# Patient Record
Sex: Male | Born: 1955 | Race: White | Hispanic: No | Marital: Single
Health system: Southern US, Community
[De-identification: ages and names within clinical notes are randomized; demographics above are authoritative.]

---

## 2018-01-13 ENCOUNTER — Other Ambulatory Visit (HOSPITAL_COMMUNITY): Payer: Medicaid Other

## 2018-01-13 ENCOUNTER — Inpatient Hospital Stay
Admission: AD | Admit: 2018-01-13 | Discharge: 2018-02-26 | Disposition: A | Discharge status: Skilled Nursing Facility | Payer: Medicaid Other | Source: Other Acute Inpatient Hospital

## 2018-01-13 DIAGNOSIS — G40909 Epilepsy, unspecified, not intractable, without status epilepticus: Secondary | ICD-10-CM

## 2018-01-13 DIAGNOSIS — J449 Chronic obstructive pulmonary disease, unspecified: Secondary | ICD-10-CM

## 2018-01-13 DIAGNOSIS — N170 Acute kidney failure with tubular necrosis: Secondary | ICD-10-CM

## 2018-01-13 DIAGNOSIS — J9621 Acute and chronic respiratory failure with hypoxia: Secondary | ICD-10-CM

## 2018-01-13 DIAGNOSIS — Z931 Gastrostomy status: Secondary | ICD-10-CM

## 2018-01-13 DIAGNOSIS — Z978 Presence of other specified devices: Secondary | ICD-10-CM

## 2018-01-13 DIAGNOSIS — A419 Sepsis, unspecified organism: Secondary | ICD-10-CM

## 2018-01-13 DIAGNOSIS — Z9289 Personal history of other medical treatment: Secondary | ICD-10-CM

## 2018-01-13 DIAGNOSIS — J4489 Other specified chronic obstructive pulmonary disease: Secondary | ICD-10-CM

## 2018-01-13 DIAGNOSIS — J189 Pneumonia, unspecified organism: Secondary | ICD-10-CM

## 2018-01-13 DIAGNOSIS — R6520 Severe sepsis without septic shock: Secondary | ICD-10-CM

## 2018-01-13 LAB — COMPREHENSIVE METABOLIC PANEL
ALBUMIN: 2.1 g/dL — AB (ref 3.5–5.0)
ALT: 21 U/L (ref 0–44)
AST: 21 U/L (ref 15–41)
Alkaline Phosphatase: 50 U/L (ref 38–126)
Anion gap: 7 (ref 5–15)
BUN: 14 mg/dL (ref 8–23)
CO2: 25 mmol/L (ref 22–32)
Calcium: 8.3 mg/dL — ABNORMAL LOW (ref 8.9–10.3)
Chloride: 102 mmol/L (ref 98–111)
Creatinine, Ser: 1.08 mg/dL (ref 0.61–1.24)
GFR calc Af Amer: >60 mL/min (ref >60)
GFR calc non Af Amer: >60 mL/min (ref >60)
Glucose, Bld: 95 mg/dL (ref 70–99)
Potassium: 3.9 mmol/L (ref 3.5–5.1)
Sodium: 134 mmol/L — ABNORMAL LOW (ref 135–145)
Total Bilirubin: 0.4 mg/dL (ref 0.3–1.2)
Total Protein: 7.2 g/dL (ref 6.5–8.1)

## 2018-01-13 LAB — BLOOD GAS, ARTERIAL
ACID-BASE EXCESS: 3.8 mmol/L — AB (ref 0.0–2.0)
Bicarbonate: 26.1 mmol/L (ref 20.0–28.0)
FIO2: 30
MECHVT: 500 mL
O2 Saturation: 98.5 %
PATIENT TEMPERATURE: 98.6
PEEP: 5 cmH2O
RATE: 18 resp/min
pCO2 arterial: 27.7 mmHg — ABNORMAL LOW (ref 32.0–48.0)
pH, Arterial: 7.58 — ABNORMAL HIGH (ref 7.350–7.450)
pO2, Arterial: 96.6 mmHg (ref 83.0–108.0)

## 2018-01-13 LAB — CBC WITH DIFFERENTIAL/PLATELET
Abs Immature Granulocytes: 0.12 10*3/uL — ABNORMAL HIGH (ref 0.00–0.07)
BASOS PCT: 0 %
Basophils Absolute: 0 10*3/uL (ref 0.0–0.1)
Eosinophils Absolute: 0.4 10*3/uL (ref 0.0–0.5)
Eosinophils Relative: 4 %
HCT: 29.7 % — ABNORMAL LOW (ref 39.0–52.0)
Hemoglobin: 9.3 g/dL — ABNORMAL LOW (ref 13.0–17.0)
IMMATURE GRANULOCYTES: 1 %
Lymphocytes Relative: 25 %
Lymphs Abs: 2.1 10*3/uL (ref 0.7–4.0)
MCH: 30 pg (ref 26.0–34.0)
MCHC: 31.3 g/dL (ref 30.0–36.0)
MCV: 95.8 fL (ref 80.0–100.0)
MONO ABS: 0.6 10*3/uL (ref 0.1–1.0)
Monocytes Relative: 8 %
NRBC: 0.2 % (ref 0.0–0.2)
Neutro Abs: 5.1 10*3/uL (ref 1.7–7.7)
Neutrophils Relative %: 62 %
Platelets: 252 10*3/uL (ref 150–400)
RBC: 3.1 MIL/uL — AB (ref 4.22–5.81)
RDW: 13.3 % (ref 11.5–15.5)
WBC: 8.3 10*3/uL (ref 4.0–10.5)

## 2018-01-13 LAB — PROTIME-INR
INR: 0.94
Prothrombin Time: 12.5 seconds (ref 11.4–15.2)

## 2018-01-14 DIAGNOSIS — J9621 Acute and chronic respiratory failure with hypoxia: Secondary | ICD-10-CM | POA: Diagnosis not present

## 2018-01-14 DIAGNOSIS — N170 Acute kidney failure with tubular necrosis: Secondary | ICD-10-CM

## 2018-01-14 DIAGNOSIS — G40909 Epilepsy, unspecified, not intractable, without status epilepticus: Secondary | ICD-10-CM

## 2018-01-14 DIAGNOSIS — R6520 Severe sepsis without septic shock: Secondary | ICD-10-CM

## 2018-01-14 DIAGNOSIS — Z9289 Personal history of other medical treatment: Secondary | ICD-10-CM | POA: Diagnosis not present

## 2018-01-14 DIAGNOSIS — J449 Chronic obstructive pulmonary disease, unspecified: Secondary | ICD-10-CM

## 2018-01-14 DIAGNOSIS — A419 Sepsis, unspecified organism: Secondary | ICD-10-CM

## 2018-01-14 LAB — BLOOD GAS, ARTERIAL
Acid-Base Excess: 2.7 mmol/L — ABNORMAL HIGH (ref 0.0–2.0)
Bicarbonate: 25.7 mmol/L (ref 20.0–28.0)
FIO2: 0.3
MECHVT: 500 mL
O2 Saturation: 99.3 %
PATIENT TEMPERATURE: 98.6
PCO2 ART: 32.5 mmHg (ref 32.0–48.0)
PEEP: 5 cmH2O
RATE: 14 resp/min
pH, Arterial: 7.509 — ABNORMAL HIGH (ref 7.350–7.450)
pO2, Arterial: 146 mmHg — ABNORMAL HIGH (ref 83.0–108.0)

## 2018-01-14 NOTE — Consult Note (Signed)
Pulmonary Critical Care Medicine [facility] [place]  PULMONARY SERVICE  Date of Service: 01/14/2018  PULMONARY CRITICAL CARE CONSULT   Joseph Patterson  GEZ:[id number]  DOB: [birth year]   DOA: 01/13/2018  Referring Physician: Carron Curie, MD  HPI: Joseph Patterson is a 63 y.o. male seen for follow up of Acute on Chronic Respiratory Failure.  Patient is transferred to our facility for further management and weaning.  This patient has a past medical history significant for sepsis chronic kidney disease hypothyroidism coronary artery disease cerebrovascular accident chronic asthma seizure disorder.  Patient is apparently a nursing home resident and back in 2016 had a stroke.  Patient subsequently has had recurrence of CVAs also has had chronic suprapubic catheter and ESBL E. coli infections.  Patient was transferred to the ED because of fevers.  Was noted to have tachycardia and also significantly hypoxic.  Patient's creatinine had also elevated from his baseline.  The patient subsequently ended up intubated on the ventilator.  And is now still intubated on the ventilator.  Chest x-ray initially was clear without any infiltrates.  He eventually was started on CPAP trials but did not tolerate.  Review of Systems:  ROS performed and is unremarkable other than noted above.   Past medical history: Hemorrhagic stroke Neurogenic bladder Chronic kidney disease Dysphagia Cardiac arrest C. difficile colitis Asthma GERD Hypertension Hypothyroidism Coronary artery disease Schizophrenia Seizure disorder  Past surgical history: Suprapubic catheter Tracheostomy PEG tube Coronary angioplasty  Allergies: Patient is allergic to warfarin unknown reaction  Social history: Resident of nursing home basically bedbound  Family history: Hyper thyroidism hypothyroidism  Medications: Reviewed on Rounds  Physical Exam:  Vitals: Temperature 97.5 pulse 67 respiratory 18 blood pressure 114/70  saturations 100%  Ventilator Settings mode of ventilation assist control FiO2 30% tidal volume 527 PEEP 5 patient is orally intubated and requiring sedation high risk airway  . General: Comfortable at this time . Eyes: Grossly normal lids, irises & conjunctiva . ENT: grossly tongue is normal . Neck: no obvious mass . Cardiovascular: S1-S2 normal no gallop or rub . Respiratory: Coarse breath sounds few scattered rhonchi . Abdomen: Obese and soft . Skin: no rash seen on limited exam . Musculoskeletal: not rigid . Psychiatric:unable to assess . Neurologic: no seizure no involuntary movements         Labs on Admission:  Basic Metabolic Panel: Recent Labs  Lab 01/13/18 1906  NA 134*  K 3.9  CL 102  CO2 25  GLUCOSE 95  BUN 14  CREATININE 1.08  CALCIUM 8.3*    Recent Labs  Lab 01/13/18 2230 01/14/18 0500  PHART 7.580* 7.509*  PCO2ART 27.7* 32.5  PO2ART 96.6 146*  HCO3 26.1 25.7  O2SAT 98.5 99.3    Liver Function Tests: Recent Labs  Lab 01/13/18 1906  AST 21  ALT 21  ALKPHOS 50  BILITOT 0.4  PROT 7.2  ALBUMIN 2.1*   No results for input(s): LIPASE, AMYLASE in the last 168 hours. No results for input(s): AMMONIA in the last 168 hours.  CBC: Recent Labs  Lab 01/13/18 1906  WBC 8.3  NEUTROABS 5.1  HGB 9.3*  HCT 29.7*  MCV 95.8  PLT 252    Cardiac Enzymes: No results for input(s): CKTOTAL, CKMB, CKMBINDEX, TROPONINI in the last 168 hours.  BNP (last 3 results) No results for input(s): BNP in the last 8760 hours.  ProBNP (last 3 results) No results for input(s): PROBNP in the last 8760 hours.   Radiological  Exams on Admission: Dg Abdomen Peg Tube Location  Result Date: 01/13/2018 CLINICAL DATA:  Peg tube placement. 30 mL of Gastrografin injected through the tube. EXAM: ABDOMEN - 1 VIEW COMPARISON:  None. FINDINGS: The injected contrast fills the stomach. There is no contrast extravasation. Gastrostomy tube projects along inferior mid stomach. Normal  bowel gas pattern. IMPRESSION: Well-positioned gastrostomy tube.  No contrast extravasation. Electronically Signed   By: Amie Portland M.D.   On: 01/13/2018 20:49   Dg Chest Port 1 View  Result Date: 01/13/2018 CLINICAL DATA:  Endotracheal tube placement. EXAM: PORTABLE CHEST 1 VIEW COMPARISON:  None. FINDINGS: Endotracheal tube tip projects 3.8 cm above the Carina. Right internal jugular central venous line, tip in the mid to lower superior vena cava. Cardiac silhouette is normal in size. No mediastinal or hilar masses. Mild opacity at the medial left lung base, most likely atelectasis. Lungs otherwise clear. No convincing pleural effusion. No pneumothorax. Multiple old left-sided rib fractures. IMPRESSION: 1. Endotracheal tube tip projects 3.8 cm above the Carina. Right internal jugular central venous line tip projects at the mid to lower superior vena cava. No pneumothorax. 2. Mild opacity at the medial left lung base, most likely atelectasis. No convincing acute cardiopulmonary disease. Electronically Signed   By: Amie Portland M.D.   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 Acute on chronic respiratory failure with hypoxia at this time patient is on full vent support.  Patient is on assist control mode is on 30% FiO2 with an endotracheal tube in place.  Patient's mechanics are poor is not able to wean at this time.  Patient is requiring sedation because of patient safety concerns that he has a high risk airway at this time.  Chest x-ray had shown some changes of atelectasis but no acute pneumonitis was observed. 2. Severe sepsis clinically improving secondary to urinary tract infection have grown procidentia cultures. 3. Acute renal failure likely  secondary to dehydration and urinary tract infection.  Now making better urine need to continue to follow the labs to improvement. 4. Seizure disorder patient has no active seizures noted at this time we will continue to monitor. 5. Chronic obstructive asthma we will continue with as needed duo nebs as needed continue supportive care  I have personally seen and evaluated the patient, evaluated laboratory and imaging results, formulated the assessment and plan and placed orders.  Patient is critically ill in danger of cardiac arrest and death requiring close monitoring secondary to high risk airway as well as ongoing sedation monitoring. The Patient requires high complexity decision making for assessment and support.  Case was discussed on Rounds with the Respiratory Therapy Staff Time Spent  Yevonne Pax, MD [facility] Pulmonary Critical Care Medicine Sleep Medicine

## 2018-01-15 DIAGNOSIS — N170 Acute kidney failure with tubular necrosis: Secondary | ICD-10-CM

## 2018-01-15 DIAGNOSIS — J449 Chronic obstructive pulmonary disease, unspecified: Secondary | ICD-10-CM | POA: Diagnosis not present

## 2018-01-15 DIAGNOSIS — G40909 Epilepsy, unspecified, not intractable, without status epilepticus: Secondary | ICD-10-CM

## 2018-01-15 DIAGNOSIS — R6520 Severe sepsis without septic shock: Secondary | ICD-10-CM

## 2018-01-15 DIAGNOSIS — J9621 Acute and chronic respiratory failure with hypoxia: Secondary | ICD-10-CM | POA: Diagnosis not present

## 2018-01-15 DIAGNOSIS — Z9289 Personal history of other medical treatment: Secondary | ICD-10-CM | POA: Diagnosis not present

## 2018-01-15 DIAGNOSIS — A419 Sepsis, unspecified organism: Secondary | ICD-10-CM

## 2018-01-15 NOTE — Progress Notes (Signed)
Pulmonary Critical Care Medicine [facility] [place]   PULMONARY CRITICAL CARE SERVICE  PROGRESS NOTE  Date of Service: 01/15/2018  Joseph Patterson  [id number]:[id number]  DOB: [birth year]   DOA: 01/13/2018  Referring Physician: Carron Curie, MD  HPI: Joseph Patterson is a 63 y.o. male seen for follow up of Acute on Chronic Respiratory Failure.  Patient remains critically ill orally intubated on the ventilator.  Was placed on spontaneous breathing trial which the patient did not tolerate today.  Respiratory rate went up and patient had drop in saturations.  Patient now is back on full support on assist control mode  Medications: Reviewed on Rounds  Physical Exam:  Vitals: Temperature 97.2 pulse 67 respiratory 18 blood pressure 132/83 saturations 100%  Ventilator Settings mode ventilation assist control FiO2 30% tidal volume 619 PEEP 5  . General: Comfortable at this time . Eyes: Grossly normal lids, irises & conjunctiva . ENT: grossly tongue is normal . Neck: no obvious mass . Cardiovascular: S1 S2 normal no gallop . Respiratory: Coarse rhonchi noted bilaterally . Abdomen: soft . Skin: no rash seen on limited exam . Musculoskeletal: not rigid . Psychiatric:unable to assess . Neurologic: no seizure no involuntary movements         Lab Data:   Basic Metabolic Panel: Recent Labs  Lab 01/13/18 1906  NA 134*  K 3.9  CL 102  CO2 25  GLUCOSE 95  BUN 14  CREATININE 1.08  CALCIUM 8.3*    ABG: Recent Labs  Lab 01/13/18 2230 01/14/18 0500  PHART 7.580* 7.509*  PCO2ART 27.7* 32.5  PO2ART 96.6 146*  HCO3 26.1 25.7  O2SAT 98.5 99.3    Liver Function Tests: Recent Labs  Lab 01/13/18 1906  AST 21  ALT 21  ALKPHOS 50  BILITOT 0.4  PROT 7.2  ALBUMIN 2.1*   No results for input(s): LIPASE, AMYLASE in the last 168 hours. No results for input(s): AMMONIA in the last 168 hours.  CBC: Recent Labs  Lab 01/13/18 1906  WBC 8.3  NEUTROABS 5.1  HGB 9.3*  HCT 29.7*   MCV 95.8  PLT 252    Cardiac Enzymes: No results for input(s): CKTOTAL, CKMB, CKMBINDEX, TROPONINI in the last 168 hours.  BNP (last 3 results) No results for input(s): BNP in the last 8760 hours.  ProBNP (last 3 results) No results for input(s): PROBNP in the last 8760 hours.  Radiological Exams: Dg Abdomen Peg Tube Location  Result Date: 01/13/2018 CLINICAL DATA:  Peg tube placement. 30 mL of Gastrografin injected through the tube. EXAM: ABDOMEN - 1 VIEW COMPARISON:  None. FINDINGS: The injected contrast fills the stomach. There is no contrast extravasation. Gastrostomy tube projects along inferior mid stomach. Normal bowel gas pattern. IMPRESSION: Well-positioned gastrostomy tube.  No contrast extravasation. Electronically Signed   By: Amie Portland M.D.   On: 01/13/2018 20:49   Dg Chest Port 1 View  Result Date: 01/13/2018 CLINICAL DATA:  Endotracheal tube placement. EXAM: PORTABLE CHEST 1 VIEW COMPARISON:  None. FINDINGS: Endotracheal tube tip projects 3.8 cm above the Carina. Right internal jugular central venous line, tip in the mid to lower superior vena cava. Cardiac silhouette is normal in size. No mediastinal or hilar masses. Mild opacity at the medial left lung base, most likely atelectasis. Lungs otherwise clear. No convincing pleural effusion. No pneumothorax. Multiple old left-sided rib fractures. IMPRESSION: 1. Endotracheal tube tip projects 3.8 cm above the Carina. Right internal jugular central venous line tip projects at the mid  to lower superior vena cava. No pneumothorax. 2. Mild opacity at the medial left lung base, most likely atelectasis. No convincing acute cardiopulmonary disease. Electronically Signed   By: Amie Portland M.D.   On: 01/13/2018 20:51    Assessment/Plan Active Problems:   Acute on chronic respiratory failure with hypoxia (HCC)   Severe sepsis (HCC)   Acute renal failure due to tubular necrosis (HCC)   Seizure disorder (HCC)   Chronic obstructive  asthma (with obstructive pulmonary disease) (HCC)   1. Acute on chronic respiratory failure with hypoxia we will continue with assessing the RSB I and mechanics.  Patient remains orally intubated high risk I think based on his failure to wean and failure to pass the RSB I he will likely need a tracheostomy.  Discussed on multidisciplinary rounds today we will try to get a ENT consultation. 2. Severe sepsis right now is hemodynamically stable secondary to urinary tract infection patient has been not treated. 3. Acute renal failure labs are improving we will continue to monitor closely. 4. Seizure disorder at baseline continue with present management. 5. Chronic obstructive asthma new labs as needed continue with supportive care   I have personally seen and evaluated the patient, evaluated laboratory and imaging results, formulated the assessment and plan and placed orders.  Time 35 minutes patient is critically ill in danger of cardiac arrest we will continue to monitor him patient has high risk airway for dislodgment orally intubated. The Patient requires high complexity decision making for assessment and support.  Case was discussed on Rounds with the Respiratory Therapy Staff  Yevonne Pax, MD [facility] Pulmonary Critical Care Medicine Sleep Medicine

## 2018-01-16 DIAGNOSIS — N170 Acute kidney failure with tubular necrosis: Secondary | ICD-10-CM | POA: Diagnosis not present

## 2018-01-16 DIAGNOSIS — G40909 Epilepsy, unspecified, not intractable, without status epilepticus: Secondary | ICD-10-CM | POA: Diagnosis not present

## 2018-01-16 DIAGNOSIS — J449 Chronic obstructive pulmonary disease, unspecified: Secondary | ICD-10-CM | POA: Diagnosis not present

## 2018-01-16 DIAGNOSIS — J9621 Acute and chronic respiratory failure with hypoxia: Secondary | ICD-10-CM | POA: Diagnosis not present

## 2018-01-16 LAB — BASIC METABOLIC PANEL
ANION GAP: 7 (ref 5–15)
BUN: 18 mg/dL (ref 8–23)
CO2: 25 mmol/L (ref 22–32)
Calcium: 8.7 mg/dL — ABNORMAL LOW (ref 8.9–10.3)
Chloride: 107 mmol/L (ref 98–111)
Creatinine, Ser: 1.08 mg/dL (ref 0.61–1.24)
GFR calc Af Amer: >60 mL/min (ref >60)
GFR calc non Af Amer: >60 mL/min (ref >60)
Glucose, Bld: 117 mg/dL — ABNORMAL HIGH (ref 70–99)
Potassium: 3.5 mmol/L (ref 3.5–5.1)
SODIUM: 139 mmol/L (ref 135–145)

## 2018-01-16 NOTE — Progress Notes (Signed)
Pulmonary Critical Care Medicine [facility] [place]   PULMONARY CRITICAL CARE SERVICE  PROGRESS NOTE  Date of Service: 01/16/2018  Joseph Patterson  [id number]:[id number]  DOB: [birth year]   DOA: 01/13/2018  Referring Physician: Carron Curie, MD  HPI: Joseph Patterson is a 63 y.o. male seen for follow up of Acute on Chronic Respiratory Failure.  Currently is on full support patient is on assist control mode endotracheal tube stays in place.  Patient has not been tolerating any weaning.  Numerous attempts have been made patient has failed the RSB I  Medications: Reviewed on Rounds  Physical Exam:  Vitals: Temperature 96.8 pulse 64 respiratory 18 blood pressure 126/68 saturations 96%  Ventilator Settings mode of ventilation assist control FiO2 28% tidal volume 525 PEEP 5  . General: Comfortable at this time . Eyes: Grossly normal lids, irises & conjunctiva . ENT: grossly tongue is normal . Neck: no obvious mass . Cardiovascular: S1 S2 normal no gallop . Respiratory: No rhonchi or rales are noted at this time . Abdomen: soft . Skin: no rash seen on limited exam . Musculoskeletal: not rigid . Psychiatric:unable to assess . Neurologic: no seizure no involuntary movements         Lab Data:   Basic Metabolic Panel: Recent Labs  Lab 01/13/18 1906 01/16/18 0532  NA 134* 139  K 3.9 3.5  CL 102 107  CO2 25 25  GLUCOSE 95 117*  BUN 14 18  CREATININE 1.08 1.08  CALCIUM 8.3* 8.7*    ABG: Recent Labs  Lab 01/13/18 2230 01/14/18 0500  PHART 7.580* 7.509*  PCO2ART 27.7* 32.5  PO2ART 96.6 146*  HCO3 26.1 25.7  O2SAT 98.5 99.3    Liver Function Tests: Recent Labs  Lab 01/13/18 1906  AST 21  ALT 21  ALKPHOS 50  BILITOT 0.4  PROT 7.2  ALBUMIN 2.1*   No results for input(s): LIPASE, AMYLASE in the last 168 hours. No results for input(s): AMMONIA in the last 168 hours.  CBC: Recent Labs  Lab 01/13/18 1906  WBC 8.3  NEUTROABS 5.1  HGB 9.3*  HCT 29.7*  MCV 95.8   PLT 252    Cardiac Enzymes: No results for input(s): CKTOTAL, CKMB, CKMBINDEX, TROPONINI in the last 168 hours.  BNP (last 3 results) No results for input(s): BNP in the last 8760 hours.  ProBNP (last 3 results) No results for input(s): PROBNP in the last 8760 hours.  Radiological Exams: No results found.  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 Acute on chronic respiratory failure with hypoxia we will continue with full support on the ventilator and assist control mode FiO2 28% tidal volume 525 PEEP 5.  Patient has not been tolerating the RSB I.  Spoke with respiratory therapy will likely need a tracheostomy. 2. Severe sepsis hemodynamically stable we will continue with present management. 3. Acute renal failure follow-up labs stable 4. Seizure disorder no active seizures we will continue to monitor closely 5. COPD PD's meter disease continue with the present management   I have personally seen and evaluated the patient, evaluated laboratory and imaging results, formulated the assessment and plan and placed orders.  Patient critically ill in danger of cardiac arrest time 35 minutes patient has a high risk airway orally  intubated The Patient requires high complexity decision making for assessment and support.  Case was discussed on Rounds with the Respiratory Therapy Staff  Yevonne PaxSaadat A Gwin Eagon, MD [facility] Pulmonary Critical Care Medicine Sleep Medicine

## 2018-01-17 DIAGNOSIS — N170 Acute kidney failure with tubular necrosis: Secondary | ICD-10-CM | POA: Diagnosis not present

## 2018-01-17 DIAGNOSIS — J9621 Acute and chronic respiratory failure with hypoxia: Secondary | ICD-10-CM | POA: Diagnosis not present

## 2018-01-17 DIAGNOSIS — Z9289 Personal history of other medical treatment: Secondary | ICD-10-CM | POA: Diagnosis not present

## 2018-01-17 DIAGNOSIS — J449 Chronic obstructive pulmonary disease, unspecified: Secondary | ICD-10-CM | POA: Diagnosis not present

## 2018-01-17 NOTE — Progress Notes (Addendum)
Pulmonary Critical Care Medicine [facility] [place]   PULMONARY CRITICAL CARE SERVICE  PROGRESS NOTE  Date of Service: 01/17/2018  Joseph Patterson  PJA:[id number]  DOB: 1955-09-19   DOA: 01/13/2018  Referring Physician: Carron Curie, MD  HPI: Joseph Patterson is a 63 y.o. male seen for follow up of Acute on Chronic Respiratory Failure.  Patient is comfortable right now was able to do about 4 hours of pressure support tolerated fairly well.  Right now is back on resting mode and assist control mode  Medications: Reviewed on Rounds  Physical Exam:  Vitals: Temperature 96.7 pulse 85 respiratory 18 blood pressure 140/80 saturations 95%  Ventilator Settings mode ventilation assist control FiO2 28% tidal volume 506 PEEP 5  . General: Comfortable at this time . Eyes: Grossly normal lids, irises & conjunctiva . ENT: grossly tongue is normal . Neck: no obvious mass . Cardiovascular: S1 S2 normal no gallop . Respiratory: No rhonchi no rales are noted at this time . Abdomen: soft . Skin: no rash seen on limited exam . Musculoskeletal: not rigid . Psychiatric:unable to assess . Neurologic: no seizure no involuntary movements         Lab Data:   Basic Metabolic Panel: Recent Labs  Lab 01/13/18 1906 01/16/18 0532  NA 134* 139  K 3.9 3.5  CL 102 107  CO2 25 25  GLUCOSE 95 117*  BUN 14 18  CREATININE 1.08 1.08  CALCIUM 8.3* 8.7*    ABG: Recent Labs  Lab 01/13/18 2230 01/14/18 0500  PHART 7.580* 7.509*  PCO2ART 27.7* 32.5  PO2ART 96.6 146*  HCO3 26.1 25.7  O2SAT 98.5 99.3    Liver Function Tests: Recent Labs  Lab 01/13/18 1906  AST 21  ALT 21  ALKPHOS 50  BILITOT 0.4  PROT 7.2  ALBUMIN 2.1*   No results for input(s): LIPASE, AMYLASE in the last 168 hours. No results for input(s): AMMONIA in the last 168 hours.  CBC: Recent Labs  Lab 01/13/18 1906  WBC 8.3  NEUTROABS 5.1  HGB 9.3*  HCT 29.7*  MCV 95.8  PLT 252    Cardiac Enzymes: No results  for input(s): CKTOTAL, CKMB, CKMBINDEX, TROPONINI in the last 168 hours.  BNP (last 3 results) No results for input(s): BNP in the last 8760 hours.  ProBNP (last 3 results) No results for input(s): PROBNP in the last 8760 hours.  Radiological Exams: No results found.  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 Acute on chronic respiratory failure with hypoxia we will continue with the wean protocol patient did do 4 hours of pressure support as mentioned. 2. Severe sepsis continue with pulmonary toilet supportive care 3. Acute renal failure due to ATN resolved we will continue with supportive care. 4. Seizure disorder stable no active seizures 5. Chronic obstructive asthma stable at this time continue with present management   I have personally seen and evaluated the patient, evaluated laboratory and imaging results, formulated the assessment and plan and placed orders.  Time 35 minutes patient is orally intubated still high risk airway awaiting tracheostomy started on wean The Patient requires high complexity decision making for assessment and support.  Case was discussed on Rounds with the Respiratory Therapy Staff  Yevonne Pax, MD [facility] Pulmonary  Critical Care Medicine Sleep Medicine

## 2018-01-18 ENCOUNTER — Other Ambulatory Visit (HOSPITAL_COMMUNITY): Payer: Medicaid Other

## 2018-01-18 DIAGNOSIS — J449 Chronic obstructive pulmonary disease, unspecified: Secondary | ICD-10-CM | POA: Diagnosis not present

## 2018-01-18 DIAGNOSIS — J9621 Acute and chronic respiratory failure with hypoxia: Secondary | ICD-10-CM | POA: Diagnosis not present

## 2018-01-18 DIAGNOSIS — Z9289 Personal history of other medical treatment: Secondary | ICD-10-CM | POA: Diagnosis not present

## 2018-01-18 DIAGNOSIS — N170 Acute kidney failure with tubular necrosis: Secondary | ICD-10-CM | POA: Diagnosis not present

## 2018-01-18 NOTE — Progress Notes (Signed)
Pulmonary Critical Care Medicine [facility] [place]   PULMONARY CRITICAL CARE SERVICE  PROGRESS NOTE  Date of Service: 01/18/2018  Joseph Patterson  WPY:[id number]  DOB: May 02, 1955   DOA: 01/13/2018  Referring Physician: Carron Curie, MD  HPI: Joseph Patterson is a 63 y.o. male seen for follow up of Acute on Chronic Respiratory Failure.  At this time patient is on full vent support.  Remains orally intubated has not been able to tolerate any weaning.  Patient yesterday was able to do 4 hours of pressure support.  Because of the inconsistency of ability to wean I recommended that at this point we get a tracheostomy done for anticipated prolonged mechanical ventilation.  Medications: Reviewed on Rounds  Physical Exam:  Vitals: Temperature 98.7 pulse 68 respiratory rate 14 blood pressure 112/68 saturations 99%  Ventilator Settings mode ventilation assist control FiO2 28% tidal volume 533 PEEP 5  . General: Comfortable at this time . Eyes: Grossly normal lids, irises & conjunctiva . ENT: grossly tongue is normal . Neck: no obvious mass . Cardiovascular: S1 S2 normal no gallop . Respiratory: No rhonchi or rales are noted at this time . Abdomen: soft . Skin: no rash seen on limited exam . Musculoskeletal: not rigid . Psychiatric:unable to assess . Neurologic: no seizure no involuntary movements         Lab Data:   Basic Metabolic Panel: Recent Labs  Lab 01/13/18 1906 01/16/18 0532  NA 134* 139  K 3.9 3.5  CL 102 107  CO2 25 25  GLUCOSE 95 117*  BUN 14 18  CREATININE 1.08 1.08  CALCIUM 8.3* 8.7*    ABG: Recent Labs  Lab 01/13/18 2230 01/14/18 0500  PHART 7.580* 7.509*  PCO2ART 27.7* 32.5  PO2ART 96.6 146*  HCO3 26.1 25.7  O2SAT 98.5 99.3    Liver Function Tests: Recent Labs  Lab 01/13/18 1906  AST 21  ALT 21  ALKPHOS 50  BILITOT 0.4  PROT 7.2  ALBUMIN 2.1*   No results for input(s): LIPASE, AMYLASE in the last 168 hours. No results for input(s):  AMMONIA in the last 168 hours.  CBC: Recent Labs  Lab 01/13/18 1906  WBC 8.3  NEUTROABS 5.1  HGB 9.3*  HCT 29.7*  MCV 95.8  PLT 252    Cardiac Enzymes: No results for input(s): CKTOTAL, CKMB, CKMBINDEX, TROPONINI in the last 168 hours.  BNP (last 3 results) No results for input(s): BNP in the last 8760 hours.  ProBNP (last 3 results) No results for input(s): PROBNP in the last 8760 hours.  Radiological Exams: No results found.  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 Acute on chronic respiratory failure with hypoxia anticipated prolonged mechanical ventilation consultation for ENT was obtained and I will get him set up for tracheostomy.  We will continue with aggressive pulmonary toilet supportive care. 2. Severe sepsis hemodynamically patient is actually stable we will continue to monitor 3. Acute renal failure tubular necrosis labs are stable 4. Seizure disorder no active seizures noted at this time. 5. COPD presumed to be severe disease we will continue with supportive care nebulizers as needed   I have personally seen and evaluated the patient, evaluated laboratory and imaging results, formulated the assessment and plan and placed orders.  Time 35 minutes patient is critically ill has a endotracheal tube in place high risk airway The Patient requires high complexity decision making for assessment and support.  Case was discussed on Rounds with the Respiratory Therapy Staff  Yevonne Pax, MD [facility] Pulmonary Critical Care Medicine Sleep Medicine

## 2018-01-19 ENCOUNTER — Other Ambulatory Visit (HOSPITAL_COMMUNITY): Payer: Medicaid Other

## 2018-01-19 DIAGNOSIS — J9621 Acute and chronic respiratory failure with hypoxia: Secondary | ICD-10-CM | POA: Diagnosis not present

## 2018-01-19 DIAGNOSIS — Z9289 Personal history of other medical treatment: Secondary | ICD-10-CM | POA: Diagnosis not present

## 2018-01-19 DIAGNOSIS — N170 Acute kidney failure with tubular necrosis: Secondary | ICD-10-CM | POA: Diagnosis not present

## 2018-01-19 DIAGNOSIS — J449 Chronic obstructive pulmonary disease, unspecified: Secondary | ICD-10-CM | POA: Diagnosis not present

## 2018-01-19 LAB — BASIC METABOLIC PANEL WITH GFR
Anion gap: 8 (ref 5–15)
BUN: 23 mg/dL (ref 8–23)
CO2: 26 mmol/L (ref 22–32)
Calcium: 8.6 mg/dL — ABNORMAL LOW (ref 8.9–10.3)
Chloride: 106 mmol/L (ref 98–111)
Creatinine, Ser: 1.17 mg/dL (ref 0.61–1.24)
GFR calc Af Amer: >60 mL/min
GFR calc non Af Amer: >60 mL/min
Glucose, Bld: 124 mg/dL — ABNORMAL HIGH (ref 70–99)
Potassium: 3.9 mmol/L (ref 3.5–5.1)
Sodium: 140 mmol/L (ref 135–145)

## 2018-01-19 NOTE — Progress Notes (Signed)
[facility] [place]   PULMONARY CRITICAL CARE SERVICE  PROGRESS NOTE  Date of Service: 01/19/2018  Joseph Patterson  [id number]:[id number]  DOB: 25-Dec-1955   DOA: 01/13/2018  Referring Physician: Carron CurieAli Hijazi, MD  HPI: Joseph Geraldlbert W Masi is a 63 y.o. male seen for follow up of Acute on Chronic Respiratory Failure.  Patient is on full vent support did not tolerate T collar this morning respiratory therapy reports that he had developed apnea.  On his current settings his set rate was 14 and he was breathing at 14.  Respiratory rate will begin by decreasing the respiratory rate and see if he is able to pick up any breath on his own.  Medications: Reviewed on Rounds  Physical Exam:  Vitals: Temperature 98.2 pulse 88 respiratory 13 blood pressure 105/61 saturations 99%  Ventilator Settings mode of ventilation assist control FiO2 30% tidal volume 516 PEEP 5  . General: Comfortable at this time . Eyes: Grossly normal lids, irises & conjunctiva . ENT: grossly tongue is normal . Neck: no obvious mass . Cardiovascular: S1 S2 normal no gallop . Respiratory: Coarse breath sounds with a few rhonchi noted . Abdomen: soft . Skin: no rash seen on limited exam . Musculoskeletal: not rigid . Psychiatric:unable to assess . Neurologic: no seizure no involuntary movements         Lab Data:   Basic Metabolic Panel: Recent Labs  Lab 01/13/18 1906 01/16/18 0532  NA 134* 139  K 3.9 3.5  CL 102 107  CO2 25 25  GLUCOSE 95 117*  BUN 14 18  CREATININE 1.08 1.08  CALCIUM 8.3* 8.7*    ABG: Recent Labs  Lab 01/13/18 2230 01/14/18 0500  PHART 7.580* 7.509*  PCO2ART 27.7* 32.5  PO2ART 96.6 146*  HCO3 26.1 25.7  O2SAT 98.5 99.3    Liver Function Tests: Recent Labs  Lab 01/13/18 1906  AST 21  ALT 21  ALKPHOS 50  BILITOT 0.4  PROT 7.2  ALBUMIN 2.1*   No results for input(s): LIPASE, AMYLASE in the last 168 hours. No results for input(s): AMMONIA in  the last 168 hours.  CBC: Recent Labs  Lab 01/13/18 1906  WBC 8.3  NEUTROABS 5.1  HGB 9.3*  HCT 29.7*  MCV 95.8  PLT 252    Cardiac Enzymes: No results for input(s): CKTOTAL, CKMB, CKMBINDEX, TROPONINI in the last 168 hours.  BNP (last 3 results) No results for input(s): BNP in the last 8760 hours.  ProBNP (last 3 results) No results for input(s): PROBNP in the last 8760 hours.  Radiological Exams: Dg Chest Port 1 View  Result Date: 01/18/2018 CLINICAL DATA:  Endotracheal tube EXAM: PORTABLE CHEST 1 VIEW COMPARISON:  01/18/2018 FINDINGS: Endotracheal tube tip appears to be in the right mainstem bronchus. Withdrawal of about 2.5 cm recommended. Shallow inspiration. Linear atelectasis in the left lung base. No airspace disease or consolidation shown in the lungs. Old left rib fractures and left scapular fracture. IMPRESSION: Endotracheal tube tip appears to be in the right mainstem bronchus. Withdrawal of about 2.5 cm recommended. These results will be called to the ordering clinician or representative by the Radiologist Assistant, and communication documented in the PACS or zVision Dashboard. Electronically Signed   By: Burman NievesWilliam  Stevens M.D.   On: 01/18/2018 22:54   Dg Chest Port 1 View  Result Date: 01/18/2018 CLINICAL DATA:  Endotracheal tube placement EXAM: PORTABLE CHEST 1 VIEW COMPARISON:  01/13/2018 FINDINGS: Shallow inspiration and patient positioning limits  the evaluation of the tube placement. An endotracheal tube is present. Tip appears to be located at the level of the carina, but this could just be due to patient positioning. Recommend repeat study with better inspiration for more accurate evaluation of tube placement level. Heart size and pulmonary vascularity are normal. Scattered fibrosis in the lungs. No focal consolidation. Probable emphysematous changes. No blunting of visualized costophrenic angles. No pneumothorax. Calcified and tortuous aorta. Probable old fracture  deformities of the left ribs and left scapula. IMPRESSION: Endotracheal tube tip appears to be at the level of the carina, but this could just be due to patient positioning. Recommend repeat study with better inspiration for more accurate evaluation. Electronically Signed   By: Burman Nieves M.D.   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 Acute on chronic respiratory failure with hypoxia we will continue with weaning attempts.  Will be reassessed for pressure support wean by respiratory therapy once the rate is decreased and he picks up some breathing on his own.  Endotracheal tube in place ENT consultation has been requested for surgery 2. Severe sepsis hemodynamically stable 3. Acute renal failure with ATN patient labs are stable 4. Seizure disorder grossly no active seizures noted 5. Chronic obstructive asthma we will continue with present management.   I have personally seen and evaluated the patient, evaluated laboratory and imaging results, formulated the assessment and plan and placed orders.  Time 35 minutes patient is critically ill in danger of cardiac arrest has a high risk airway The Patient requires high complexity decision making for assessment and support.  Case was discussed on Rounds with the Respiratory Therapy Staff  Yevonne Pax, MD [facility] Pulmonary Critical Care Medicine Sleep Medicine

## 2018-01-20 DIAGNOSIS — Z978 Presence of other specified devices: Secondary | ICD-10-CM | POA: Diagnosis not present

## 2018-01-20 DIAGNOSIS — N170 Acute kidney failure with tubular necrosis: Secondary | ICD-10-CM | POA: Diagnosis not present

## 2018-01-20 DIAGNOSIS — J449 Chronic obstructive pulmonary disease, unspecified: Secondary | ICD-10-CM | POA: Diagnosis not present

## 2018-01-20 DIAGNOSIS — J9621 Acute and chronic respiratory failure with hypoxia: Secondary | ICD-10-CM | POA: Diagnosis not present

## 2018-01-20 NOTE — Progress Notes (Signed)
Pulmonary Critical Care Medicine [facility] [place]   PULMONARY CRITICAL CARE SERVICE  PROGRESS NOTE  Date of Service: 01/20/2018  Joseph Patterson  NAT:[id number]  DOB: 10/02/1955   DOA: 01/13/2018  Referring Physician: Carron Curie, MD  HPI: Joseph Patterson is a 63 y.o. male seen for follow up of Acute on Chronic Respiratory Failure.  Patient right now is on pressure support mode on 30% FiO2 the goal is for about 12-hour wean today.  Looks good so far  Medications: Reviewed on Rounds  Physical Exam:  Vitals: Temperature 96.9 pulse 62 respiratory rate 14 blood pressure 107/63 saturations 100%  Ventilator Settings mode ventilation pressure support FiO2 30% tidal volume is 800 pressure support 12 PEEP 5  . General: Comfortable at this time . Eyes: Grossly normal lids, irises & conjunctiva . ENT: grossly tongue is normal . Neck: no obvious mass . Cardiovascular: S1 S2 normal no gallop . Respiratory: No rhonchi no rales are noted at this time . Abdomen: soft . Skin: no rash seen on limited exam . Musculoskeletal: not rigid . Psychiatric:unable to assess . Neurologic: no seizure no involuntary movements         Lab Data:   Basic Metabolic Panel: Recent Labs  Lab 01/13/18 1906 01/16/18 0532 01/19/18 0631  NA 134* 139 140  K 3.9 3.5 3.9  CL 102 107 106  CO2 25 25 26   GLUCOSE 95 117* 124*  BUN 14 18 23   CREATININE 1.08 1.08 1.17  CALCIUM 8.3* 8.7* 8.6*    ABG: Recent Labs  Lab 01/13/18 2230 01/14/18 0500  PHART 7.580* 7.509*  PCO2ART 27.7* 32.5  PO2ART 96.6 146*  HCO3 26.1 25.7  O2SAT 98.5 99.3    Liver Function Tests: Recent Labs  Lab 01/13/18 1906  AST 21  ALT 21  ALKPHOS 50  BILITOT 0.4  PROT 7.2  ALBUMIN 2.1*   No results for input(s): LIPASE, AMYLASE in the last 168 hours. No results for input(s): AMMONIA in the last 168 hours.  CBC: Recent Labs  Lab 01/13/18 1906  WBC 8.3  NEUTROABS 5.1  HGB 9.3*  HCT 29.7*  MCV 95.8  PLT 252     Cardiac Enzymes: No results for input(s): CKTOTAL, CKMB, CKMBINDEX, TROPONINI in the last 168 hours.  BNP (last 3 results) No results for input(s): BNP in the last 8760 hours.  ProBNP (last 3 results) No results for input(s): PROBNP in the last 8760 hours.  Radiological Exams: Dg Chest Port 1 View  Result Date: 01/19/2018 CLINICAL DATA:  Evaluate ET tube placement EXAM: PORTABLE CHEST 1 VIEW COMPARISON:  01/18/2018 FINDINGS: ETT tip is situated above the carina. The heart size is normal. No pleural effusion or edema identified. No focal airspace opacities. IMPRESSION: ETT tip is stable above the carina. Electronically Signed   By: Signa Kell M.D.   On: 01/19/2018 12:19   Dg Chest Port 1 View  Result Date: 01/18/2018 CLINICAL DATA:  Endotracheal tube EXAM: PORTABLE CHEST 1 VIEW COMPARISON:  01/18/2018 FINDINGS: Endotracheal tube tip appears to be in the right mainstem bronchus. Withdrawal of about 2.5 cm recommended. Shallow inspiration. Linear atelectasis in the left lung base. No airspace disease or consolidation shown in the lungs. Old left rib fractures and left scapular fracture. IMPRESSION: Endotracheal tube tip appears to be in the right mainstem bronchus. Withdrawal of about 2.5 cm recommended. These results will be called to the ordering clinician or representative by the Radiologist Assistant, and communication documented in the PACS  or zVision Dashboard. Electronically Signed   By: Burman NievesWilliam  Stevens M.D.   On: 01/18/2018 22:54   Dg Chest Port 1 View  Result Date: 01/18/2018 CLINICAL DATA:  Endotracheal tube placement EXAM: PORTABLE CHEST 1 VIEW COMPARISON:  01/13/2018 FINDINGS: Shallow inspiration and patient positioning limits the evaluation of the tube placement. An endotracheal tube is present. Tip appears to be located at the level of the carina, but this could just be due to patient positioning. Recommend repeat study with better inspiration for more accurate evaluation of  tube placement level. Heart size and pulmonary vascularity are normal. Scattered fibrosis in the lungs. No focal consolidation. Probable emphysematous changes. No blunting of visualized costophrenic angles. No pneumothorax. Calcified and tortuous aorta. Probable old fracture deformities of the left ribs and left scapula. IMPRESSION: Endotracheal tube tip appears to be at the level of the carina, but this could just be due to patient positioning. Recommend repeat study with better inspiration for more accurate evaluation. Electronically Signed   By: Burman NievesWilliam  Stevens M.D.   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 Acute on chronic respiratory failure with hypoxia we will continue weaning on pressure support mode patient still has endotracheal tube in place high risk for dislodgment.  Need to continue to monitor.  Patient will need to tracheostomy as discussed with ENT. 2. Severe sepsis at baseline continue present management 3. Acute renal failure due to tubular necrosis labs are improved 4. Seizure disorder no active seizures 5. Chronic obstructive asthma at baseline we will continue present therapy   I have personally seen and evaluated the patient, evaluated laboratory and imaging results, formulated the assessment and plan and placed orders. The Patient requires high complexity decision making for assessment and support.  Case was discussed on Rounds with the Respiratory Therapy Staff  Yevonne PaxSaadat A , MD [facility] Pulmonary Critical Care Medicine Sleep Medicine

## 2018-01-21 DIAGNOSIS — J9621 Acute and chronic respiratory failure with hypoxia: Secondary | ICD-10-CM | POA: Diagnosis not present

## 2018-01-21 DIAGNOSIS — Z978 Presence of other specified devices: Secondary | ICD-10-CM | POA: Diagnosis not present

## 2018-01-21 DIAGNOSIS — N170 Acute kidney failure with tubular necrosis: Secondary | ICD-10-CM | POA: Diagnosis not present

## 2018-01-21 DIAGNOSIS — J449 Chronic obstructive pulmonary disease, unspecified: Secondary | ICD-10-CM | POA: Diagnosis not present

## 2018-01-21 NOTE — Progress Notes (Signed)
Pulmonary Critical Care Medicine [facility] [place]   PULMONARY CRITICAL CARE SERVICE  PROGRESS NOTE  Date of Service: 01/21/2018  Joseph Patterson  [id number]:[id number]  DOB: [birth year]   DOA: 01/13/2018  Referring Physician: Carron Curie, MD  HPI: Joseph Patterson is a 63 y.o. male seen for follow up of Acute on Chronic Respiratory Failure.  Patient is currently on pressure support mode has been on 30% FiO2 weaning seems to be tolerating it well so far  Medications: Reviewed on Rounds  Physical Exam:  Vitals: Temperature 98.0 pulse 74 respiratory 19 blood pressure 117/66 saturations 100%  Ventilator Settings mode ventilation pressure support FiO2 30% tidal volume 428 pressure support 12 PEEP 5  . General: Comfortable at this time . Eyes: Grossly normal lids, irises & conjunctiva . ENT: grossly tongue is normal . Neck: no obvious mass . Cardiovascular: S1 S2 normal no gallop . Respiratory: Scattered rhonchi expansion is equal . Abdomen: soft . Skin: no rash seen on limited exam . Musculoskeletal: not rigid . Psychiatric:unable to assess . Neurologic: no seizure no involuntary movements         Lab Data:   Basic Metabolic Panel: Recent Labs  Lab 01/16/18 0532 01/19/18 0631  NA 139 140  K 3.5 3.9  CL 107 106  CO2 25 26  GLUCOSE 117* 124*  BUN 18 23  CREATININE 1.08 1.17  CALCIUM 8.7* 8.6*    ABG: No results for input(s): PHART, PCO2ART, PO2ART, HCO3, O2SAT in the last 168 hours.  Liver Function Tests: No results for input(s): AST, ALT, ALKPHOS, BILITOT, PROT, ALBUMIN in the last 168 hours. No results for input(s): LIPASE, AMYLASE in the last 168 hours. No results for input(s): AMMONIA in the last 168 hours.  CBC: No results for input(s): WBC, NEUTROABS, HGB, HCT, MCV, PLT in the last 168 hours.  Cardiac Enzymes: No results for input(s): CKTOTAL, CKMB, CKMBINDEX, TROPONINI in the last 168 hours.  BNP (last 3 results) No results for input(s): BNP in the  last 8760 hours.  ProBNP (last 3 results) No results for input(s): PROBNP in the last 8760 hours.  Radiological Exams: No results found.  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 Acute on chronic respiratory failure with hypoxia continue weaning on protocol pressure support mode 2. Severe sepsis hemodynamically stable 3. Acute renal failure due to ATN treated improving 4. Seizure disorder no active seizures 5. COPD stable at this time we will continue with present management   I have personally seen and evaluated the patient, evaluated laboratory and imaging results, formulated the assessment and plan and placed orders. The Patient requires high complexity decision making for assessment and support.  Case was discussed on Rounds with the Respiratory Therapy Staff  Yevonne Pax, MD [facility] Pulmonary Critical Care Medicine Sleep Medicine

## 2018-01-22 DIAGNOSIS — N170 Acute kidney failure with tubular necrosis: Secondary | ICD-10-CM | POA: Diagnosis not present

## 2018-01-22 DIAGNOSIS — J449 Chronic obstructive pulmonary disease, unspecified: Secondary | ICD-10-CM | POA: Diagnosis not present

## 2018-01-22 DIAGNOSIS — Z978 Presence of other specified devices: Secondary | ICD-10-CM | POA: Diagnosis not present

## 2018-01-22 DIAGNOSIS — J9621 Acute and chronic respiratory failure with hypoxia: Secondary | ICD-10-CM | POA: Diagnosis not present

## 2018-01-22 LAB — BASIC METABOLIC PANEL
Anion gap: 8 (ref 5–15)
BUN: 19 mg/dL (ref 8–23)
CO2: 27 mmol/L (ref 22–32)
Calcium: 8.6 mg/dL — ABNORMAL LOW (ref 8.9–10.3)
Chloride: 100 mmol/L (ref 98–111)
Creatinine, Ser: 0.94 mg/dL (ref 0.61–1.24)
GFR calc non Af Amer: >60 mL/min (ref >60)
Glucose, Bld: 112 mg/dL — ABNORMAL HIGH (ref 70–99)
Potassium: 4.2 mmol/L (ref 3.5–5.1)
Sodium: 135 mmol/L (ref 135–145)

## 2018-01-22 NOTE — Progress Notes (Signed)
Pulmonary Critical Care Medicine [facility] [place]   PULMONARY CRITICAL CARE SERVICE  PROGRESS NOTE  Date of Service: 01/22/2018  Joseph Patterson  [id number]  DOB: 1955/05/15   DOA: 01/13/2018  Referring Physician: Carron Curie, MD  HPI: Joseph Patterson is a 63 y.o. male seen for follow up of Acute on Chronic Respiratory Failure.  Patient remains on pressure support mode at this time the goal is for 20 hours.  Remains orally intubated awaiting tracheostomy.  Medications: Reviewed on Rounds  Physical Exam:  Vitals: Temperature 96.7 pulse 72 respiratory rate 18 blood pressure 151/57 saturations 97%  Ventilator Settings mode ventilation pressure support FiO2 40% tidal line 467 per support 12 PEEP 5  . General: Comfortable at this time . Eyes: Grossly normal lids, irises & conjunctiva . ENT: grossly tongue is normal . Neck: no obvious mass . Cardiovascular: S1 S2 normal no gallop . Respiratory: Coarse breath sounds with few rhonchi noted . Abdomen: soft . Skin: no rash seen on limited exam . Musculoskeletal: not rigid . Psychiatric:unable to assess . Neurologic: no seizure no involuntary movements         Lab Data:   Basic Metabolic Panel: Recent Labs  Lab 01/16/18 0532 01/19/18 0631 01/22/18 0536  NA 139 140 135  K 3.5 3.9 4.2  CL 107 106 100  CO2 25 26 27   GLUCOSE 117* 124* 112*  BUN 18 23 19   CREATININE 1.08 1.17 0.94  CALCIUM 8.7* 8.6* 8.6*    ABG: No results for input(s): PHART, PCO2ART, PO2ART, HCO3, O2SAT in the last 168 hours.  Liver Function Tests: No results for input(s): AST, ALT, ALKPHOS, BILITOT, PROT, ALBUMIN in the last 168 hours. No results for input(s): LIPASE, AMYLASE in the last 168 hours. No results for input(s): AMMONIA in the last 168 hours.  CBC: No results for input(s): WBC, NEUTROABS, HGB, HCT, MCV, PLT in the last 168 hours.  Cardiac Enzymes: No results for input(s): CKTOTAL, CKMB, CKMBINDEX, TROPONINI in the last 168  hours.  BNP (last 3 results) No results for input(s): BNP in the last 8760 hours.  ProBNP (last 3 results) No results for input(s): PROBNP in the last 8760 hours.  Radiological Exams: No results found.  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 Acute on chronic respiratory failure with hypoxia we will continue with pressure support mode titrate oxygen down as tolerated the goal today is 20 hours 2. Severe sepsis hemodynamically stable 3. Acute renal failure labs are improving 4. Seizure disorder no active seizures 5. Chronic obstructive asthma at baseline continue with present management   I have personally seen and evaluated the patient, evaluated laboratory and imaging results, formulated the assessment and plan and placed orders. The Patient requires high complexity decision making for assessment and support.  Case was discussed on Rounds with the Respiratory Therapy Staff  Yevonne Pax, MD [facility] Pulmonary Critical Care Medicine Sleep Medicine

## 2018-01-23 DIAGNOSIS — J9621 Acute and chronic respiratory failure with hypoxia: Secondary | ICD-10-CM | POA: Diagnosis not present

## 2018-01-23 DIAGNOSIS — N170 Acute kidney failure with tubular necrosis: Secondary | ICD-10-CM | POA: Diagnosis not present

## 2018-01-23 DIAGNOSIS — Z978 Presence of other specified devices: Secondary | ICD-10-CM | POA: Diagnosis not present

## 2018-01-23 DIAGNOSIS — J449 Chronic obstructive pulmonary disease, unspecified: Secondary | ICD-10-CM | POA: Diagnosis not present

## 2018-01-23 NOTE — Progress Notes (Signed)
Pulmonary Critical Care Medicine [facility] [place]   PULMONARY CRITICAL CARE SERVICE  PROGRESS NOTE  Date of Service: 01/23/2018  Joseph Patterson  [id number]  DOB: Jun 19, 1955   DOA: 01/13/2018  Referring Physician: Carron Curie, MD  HPI: Joseph Patterson is a 63 y.o. male seen for follow up of Acute on Chronic Respiratory Failure.  Patient remains endotracheally intubated.  Patient is currently on a pressure support wean has been on 28% FiO2 awaiting ENT consultation for surgery  Medications: Reviewed on Rounds  Physical Exam:  Vitals: Temperature 98.1 pulse 80 respiratory 30 blood pressure 135/72 saturation 97%  Ventilator Settings mode ventilation pressure support FiO2 28% tidal volume 368 pressure support 12 PEEP 5  . General: Comfortable at this time . Eyes: Grossly normal lids, irises & conjunctiva . ENT: grossly tongue is normal . Neck: no obvious mass . Cardiovascular: S1 S2 normal no gallop . Respiratory: No rhonchi or rales are noted at this time . Abdomen: soft . Skin: no rash seen on limited exam . Musculoskeletal: not rigid . Psychiatric:unable to assess . Neurologic: no seizure no involuntary movements         Lab Data:   Basic Metabolic Panel: Recent Labs  Lab 01/19/18 0631 01/22/18 0536  NA 140 135  K 3.9 4.2  CL 106 100  CO2 26 27  GLUCOSE 124* 112*  BUN 23 19  CREATININE 1.17 0.94  CALCIUM 8.6* 8.6*    ABG: No results for input(s): PHART, PCO2ART, PO2ART, HCO3, O2SAT in the last 168 hours.  Liver Function Tests: No results for input(s): AST, ALT, ALKPHOS, BILITOT, PROT, ALBUMIN in the last 168 hours. No results for input(s): LIPASE, AMYLASE in the last 168 hours. No results for input(s): AMMONIA in the last 168 hours.  CBC: No results for input(s): WBC, NEUTROABS, HGB, HCT, MCV, PLT in the last 168 hours.  Cardiac Enzymes: No results for input(s): CKTOTAL, CKMB, CKMBINDEX, TROPONINI in the last 168 hours.  BNP (last 3  results) No results for input(s): BNP in the last 8760 hours.  ProBNP (last 3 results) No results for input(s): PROBNP in the last 8760 hours.  Radiological Exams: No results found.  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 Acute on chronic respiratory failure with hypoxia we will continue with a pressure support at this time is not going to be able to extubate and therefore will require a tracheostomy which we are waiting 2. Severe sepsis hemodynamically stable we will continue with supportive care 3. Acute renal failure resolved 4. Seizure disorder continue present management 5. Chronic asthma stable at this time continue with supportive care   I have personally seen and evaluated the patient, evaluated laboratory and imaging results, formulated the assessment and plan and placed orders. The Patient requires high complexity decision making for assessment and support.  Case was discussed on Rounds with the Respiratory Therapy Staff  Yevonne Pax, MD [facility] Pulmonary Critical Care Medicine Sleep Medicine

## 2018-01-24 DIAGNOSIS — J9621 Acute and chronic respiratory failure with hypoxia: Secondary | ICD-10-CM | POA: Diagnosis not present

## 2018-01-24 DIAGNOSIS — J449 Chronic obstructive pulmonary disease, unspecified: Secondary | ICD-10-CM | POA: Diagnosis not present

## 2018-01-24 DIAGNOSIS — Z978 Presence of other specified devices: Secondary | ICD-10-CM | POA: Diagnosis not present

## 2018-01-24 DIAGNOSIS — N170 Acute kidney failure with tubular necrosis: Secondary | ICD-10-CM | POA: Diagnosis not present

## 2018-01-24 LAB — BLOOD GAS, ARTERIAL
Acid-Base Excess: 4.4 mmol/L — ABNORMAL HIGH (ref 0.0–2.0)
Bicarbonate: 28.2 mmol/L — ABNORMAL HIGH (ref 20.0–28.0)
FIO2: 28
O2 Saturation: 97.8 %
PEEP: 5 cmH2O
Pressure support: 12 cmH2O
pCO2 arterial: 40.8 mmHg (ref 32.0–48.0)
pH, Arterial: 7.454 — ABNORMAL HIGH (ref 7.350–7.450)
pO2, Arterial: 83.9 mmHg (ref 83.0–108.0)

## 2018-01-24 NOTE — Progress Notes (Signed)
Pulmonary Critical Care Medicine [facility] [place]   PULMONARY CRITICAL CARE SERVICE  PROGRESS NOTE  Date of Service: 01/24/2018  Joseph Patterson  [id number]  DOB: [birth year]   DOA: 01/13/2018  Referring Physician: Carron Curie, MD  HPI: Joseph Patterson is a 63 y.o. male seen for follow up of Acute on Chronic Respiratory Failure.  Patient is on pressure support mode he has been about 48 hours on the pressure support.  Our plan was to extubate him however he did not pass the cuff leak test.  It was opted to keep him on the ventilator.  We will have ENT see him for tracheostomy  Medications: Reviewed on Rounds  Physical Exam:  Vitals: Temperature 97.6 pulse 81 respiratory 24 blood pressure 144/83 saturations 98%  Ventilator Settings mode of ventilation pressure support FiO2 28% tidal volume 443 PEEP 5  . General: Comfortable at this time . Eyes: Grossly normal lids, irises & conjunctiva . ENT: grossly tongue is normal . Neck: no obvious mass . Cardiovascular: S1 S2 normal no gallop . Respiratory: No rhonchi no rales are noted at this time . Abdomen: soft . Skin: no rash seen on limited exam . Musculoskeletal: not rigid . Psychiatric:unable to assess . Neurologic: no seizure no involuntary movements         Lab Data:   Basic Metabolic Panel: Recent Labs  Lab 01/19/18 0631 01/22/18 0536  NA 140 135  K 3.9 4.2  CL 106 100  CO2 26 27  GLUCOSE 124* 112*  BUN 23 19  CREATININE 1.17 0.94  CALCIUM 8.6* 8.6*    ABG: No results for input(s): PHART, PCO2ART, PO2ART, HCO3, O2SAT in the last 168 hours.  Liver Function Tests: No results for input(s): AST, ALT, ALKPHOS, BILITOT, PROT, ALBUMIN in the last 168 hours. No results for input(s): LIPASE, AMYLASE in the last 168 hours. No results for input(s): AMMONIA in the last 168 hours.  CBC: No results for input(s): WBC, NEUTROABS, HGB, HCT, MCV, PLT in the last 168 hours.  Cardiac Enzymes: No results for  input(s): CKTOTAL, CKMB, CKMBINDEX, TROPONINI in the last 168 hours.  BNP (last 3 results) No results for input(s): BNP in the last 8760 hours.  ProBNP (last 3 results) No results for input(s): PROBNP in the last 8760 hours.  Radiological Exams: No results found.  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 Acute on chronic respiratory failure with hypoxia we will continue with full support on pressure support continue pulmonary toilet secretion management. 2. Severe sepsis hemodynamically stable we will continue present management 3. Acute renal failure improved 4. Seizure disorder no active seizures 5. COPD continue with supportive care   I have personally seen and evaluated the patient, evaluated laboratory and imaging results, formulated the assessment and plan and placed orders. The Patient requires high complexity decision making for assessment and support.  Case was discussed on Rounds with the Respiratory Therapy Staff  Yevonne Pax, MD [facility] Pulmonary Critical Care Medicine Sleep Medicine

## 2018-01-25 DIAGNOSIS — J9621 Acute and chronic respiratory failure with hypoxia: Secondary | ICD-10-CM | POA: Diagnosis not present

## 2018-01-25 DIAGNOSIS — Z978 Presence of other specified devices: Secondary | ICD-10-CM | POA: Diagnosis not present

## 2018-01-25 DIAGNOSIS — N170 Acute kidney failure with tubular necrosis: Secondary | ICD-10-CM | POA: Diagnosis not present

## 2018-01-25 DIAGNOSIS — J449 Chronic obstructive pulmonary disease, unspecified: Secondary | ICD-10-CM | POA: Diagnosis not present

## 2018-01-25 LAB — BASIC METABOLIC PANEL
Anion gap: 9 (ref 5–15)
BUN: 20 mg/dL (ref 8–23)
CHLORIDE: 100 mmol/L (ref 98–111)
CO2: 28 mmol/L (ref 22–32)
Calcium: 9 mg/dL (ref 8.9–10.3)
Creatinine, Ser: 0.97 mg/dL (ref 0.61–1.24)
GFR calc Af Amer: >60 mL/min (ref >60)
GFR calc non Af Amer: >60 mL/min (ref >60)
Glucose, Bld: 96 mg/dL (ref 70–99)
Potassium: 4.2 mmol/L (ref 3.5–5.1)
Sodium: 137 mmol/L (ref 135–145)

## 2018-01-25 NOTE — Progress Notes (Signed)
Pulmonary Critical Care Medicine [facility] [place]   PULMONARY CRITICAL CARE SERVICE  PROGRESS NOTE  Date of Service: 01/25/2018  Joseph Patterson  [id number]  DOB: Mar 09, 1955   DOA: 01/13/2018  Referring Physician: Carron Curie, MD  HPI: Joseph Patterson is a 63 y.o. male seen for follow up of Acute on Chronic Respiratory Failure.  Right now patient is on pressure support mode he is actually tolerating the wean fairly well yesterday and attempt was made to extubate however he did not have much air movement on deflation of the cuff so we opted to keep the trach option open and therefore will await for ENT input  Medications: Reviewed on Rounds  Physical Exam:  Vitals: Temperature 97.6 pulse 74 respiratory 19 blood pressure 133/79 saturations 97%  Ventilator Settings mode ventilation with pressure support FiO2 28% tidal volume 474 pressure support 12 PEEP 5  . General: Comfortable at this time . Eyes: Grossly normal lids, irises & conjunctiva . ENT: grossly tongue is normal . Neck: no obvious mass . Cardiovascular: S1 S2 normal no gallop . Respiratory: No rhonchi or rales are noted at this time . Abdomen: soft . Skin: no rash seen on limited exam . Musculoskeletal: not rigid . Psychiatric:unable to assess . Neurologic: no seizure no involuntary movements         Lab Data:   Basic Metabolic Panel: Recent Labs  Lab 01/19/18 0631 01/22/18 0536 01/25/18 0639  NA 140 135 137  K 3.9 4.2 4.2  CL 106 100 100  CO2 26 27 28   GLUCOSE 124* 112* 96  BUN 23 19 20   CREATININE 1.17 0.94 0.97  CALCIUM 8.6* 8.6* 9.0    ABG: Recent Labs  Lab 01/24/18 1015  PHART 7.454*  PCO2ART 40.8  PO2ART 83.9  HCO3 28.2*  O2SAT 97.8    Liver Function Tests: No results for input(s): AST, ALT, ALKPHOS, BILITOT, PROT, ALBUMIN in the last 168 hours. No results for input(s): LIPASE, AMYLASE in the last 168 hours. No results for input(s): AMMONIA in the last 168 hours.  CBC: No  results for input(s): WBC, NEUTROABS, HGB, HCT, MCV, PLT in the last 168 hours.  Cardiac Enzymes: No results for input(s): CKTOTAL, CKMB, CKMBINDEX, TROPONINI in the last 168 hours.  BNP (last 3 results) No results for input(s): BNP in the last 8760 hours.  ProBNP (last 3 results) No results for input(s): PROBNP in the last 8760 hours.  Radiological Exams: No results found.  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 Acute on chronic respiratory failure with hypoxia we will continue with the pressure support at this time.  Once the tracheostomy is done I think he should be able to go straight to T collar. 2. Severe sepsis resolved 3. ATN resolved 4. Seizure disorder no active seizures noted 5. Chronic asthma at baseline we will continue with present management   I have personally seen and evaluated the patient, evaluated laboratory and imaging results, formulated the assessment and plan and placed orders. The Patient requires high complexity decision making for assessment and support.  Case was discussed on Rounds with the Respiratory Therapy Staff  Yevonne Pax, MD [facility] Pulmonary Critical Care Medicine Sleep Medicine

## 2018-01-26 DIAGNOSIS — J9621 Acute and chronic respiratory failure with hypoxia: Secondary | ICD-10-CM | POA: Diagnosis not present

## 2018-01-26 DIAGNOSIS — N170 Acute kidney failure with tubular necrosis: Secondary | ICD-10-CM | POA: Diagnosis not present

## 2018-01-26 DIAGNOSIS — Z978 Presence of other specified devices: Secondary | ICD-10-CM | POA: Diagnosis not present

## 2018-01-26 DIAGNOSIS — J449 Chronic obstructive pulmonary disease, unspecified: Secondary | ICD-10-CM | POA: Diagnosis not present

## 2018-01-26 NOTE — Progress Notes (Signed)
Pulmonary Critical Care Medicine [facility] [place]   PULMONARY CRITICAL CARE SERVICE  PROGRESS NOTE  Date of Service: 01/26/2018  [name]  [id number]  DOB: 13-Dec-1955   DOA: 01/13/2018  Referring Physician: Carron Curie, MD  HPI: Joseph Patterson is a 63 y.o. male seen for follow up of Acute on Chronic Respiratory Failure.  At this time patient is on pressure support weaning which he has been tolerating fairly well.  Currently is on 28% FiO2.  Patient will have cuff leak test checked again today  Medications: Reviewed on Rounds  Physical Exam:  Vitals: Temperature 97.6 pulse 83 respiratory rate 16 blood pressure 120/74 saturations 96%  Ventilator Settings mode ventilation pressure support FiO2 28% tidal volume 431 pressure support 12 PEEP 5  . General: Comfortable at this time . Eyes: Grossly normal lids, irises & conjunctiva . ENT: grossly tongue is normal . Neck: no obvious mass . Cardiovascular: S1 S2 normal no gallop . Respiratory: No rhonchi or rales are noted at this time . Abdomen: soft . Skin: no rash seen on limited exam . Musculoskeletal: not rigid . Psychiatric:unable to assess . Neurologic: no seizure no involuntary movements         Lab Data:   Basic Metabolic Panel: Recent Labs  Lab 01/22/18 0536 01/25/18 0639  NA 135 137  K 4.2 4.2  CL 100 100  CO2 27 28  GLUCOSE 112* 96  BUN 19 20  CREATININE 0.94 0.97  CALCIUM 8.6* 9.0    ABG: Recent Labs  Lab 01/24/18 1015  PHART 7.454*  PCO2ART 40.8  PO2ART 83.9  HCO3 28.2*  O2SAT 97.8    Liver Function Tests: No results for input(s): AST, ALT, ALKPHOS, BILITOT, PROT, ALBUMIN in the last 168 hours. No results for input(s): LIPASE, AMYLASE in the last 168 hours. No results for input(s): AMMONIA in the last 168 hours.  CBC: No results for input(s): WBC, NEUTROABS, HGB, HCT, MCV, PLT in the last 168 hours.  Cardiac Enzymes: No results for input(s): CKTOTAL, CKMB, CKMBINDEX,  TROPONINI in the last 168 hours.  BNP (last 3 results) No results for input(s): BNP in the last 8760 hours.  ProBNP (last 3 results) No results for input(s): PROBNP in the last 8760 hours.  Radiological Exams: No results found.  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 Acute on chronic respiratory failure with hypoxia we will continue with the wean protocol check cuff leak today if adequate then consider extubation.  I suspect the size of the ET tube may be what caused the abnormal cuff leak yesterday.  Will reassess 2. Severe sepsis resolved hemodynamically stable 3. ATN resolved continue with present management 4. Seizure disorder no active seizures 5. Chronic asthma at baseline we will continue with present therapy   I have personally seen and evaluated the patient, evaluated laboratory and imaging results, formulated the assessment and plan and placed orders. The Patient requires high complexity decision making for assessment and support.  Case was discussed on Rounds with the Respiratory Therapy Staff  Yevonne Pax, MD [facility] Pulmonary Critical Care Medicine Sleep Medicine

## 2018-01-27 DIAGNOSIS — J9621 Acute and chronic respiratory failure with hypoxia: Secondary | ICD-10-CM | POA: Diagnosis not present

## 2018-01-27 DIAGNOSIS — Z978 Presence of other specified devices: Secondary | ICD-10-CM | POA: Diagnosis not present

## 2018-01-27 DIAGNOSIS — N170 Acute kidney failure with tubular necrosis: Secondary | ICD-10-CM | POA: Diagnosis not present

## 2018-01-27 DIAGNOSIS — J449 Chronic obstructive pulmonary disease, unspecified: Secondary | ICD-10-CM | POA: Diagnosis not present

## 2018-01-27 NOTE — Progress Notes (Signed)
[facility] [place]   PULMONARY CRITICAL CARE SERVICE  PROGRESS NOTE  Date of Service: 01/27/2018  Joseph Geraldlbert W Lehmkuhl  [id number]  DOB: [birth year]   DOA: 01/13/2018  Referring Physician: Carron CurieAli Hijazi, MD  HPI: Joseph Patterson is a 63 y.o. male seen for follow up of Acute on Chronic Respiratory Failure.  Patient is on pressure support wean this morning.  Remains on 28% FiO2 spoke with respiratory therapy possibility of evaluating for potential extubation to BiPAP  Medications: Reviewed on Rounds  Physical Exam:  Vitals: Temperature 98.0 pulse 88 respiratory rate 16 blood pressure 137/68 saturations 97%  Ventilator Settings mode ventilation pressure support FiO2 28% tidal volume 511 pressure 12 PEEP 5  . General: Comfortable at this time . Eyes: Grossly normal lids, irises & conjunctiva . ENT: grossly tongue is normal . Neck: no obvious mass . Cardiovascular: S1 S2 normal no gallop . Respiratory: No rhonchi or rales are noted at this time . Abdomen: soft . Skin: no rash seen on limited exam . Musculoskeletal: not rigid . Psychiatric:unable to assess . Neurologic: no seizure no involuntary movements         Lab Data:   Basic Metabolic Panel: Recent Labs  Lab 01/22/18 0536 01/25/18 0639  NA 135 137  K 4.2 4.2  CL 100 100  CO2 27 28  GLUCOSE 112* 96  BUN 19 20  CREATININE 0.94 0.97  CALCIUM 8.6* 9.0    ABG: Recent Labs  Lab 01/24/18 1015  PHART 7.454*  PCO2ART 40.8  PO2ART 83.9  HCO3 28.2*  O2SAT 97.8    Liver Function Tests: No results for input(s): AST, ALT, ALKPHOS, BILITOT, PROT, ALBUMIN in the last 168 hours. No results for input(s): LIPASE, AMYLASE in the last 168 hours. No results for input(s): AMMONIA in the last 168 hours.  CBC: No results for input(s): WBC, NEUTROABS, HGB, HCT, MCV, PLT in the last 168 hours.  Cardiac Enzymes: No results for input(s): CKTOTAL, CKMB, CKMBINDEX, TROPONINI in the last 168  hours.  BNP (last 3 results) No results for input(s): BNP in the last 8760 hours.  ProBNP (last 3 results) No results for input(s): PROBNP in the last 8760 hours.  Radiological Exams: No results found.  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 Acute on chronic respiratory failure with hypoxia we will continue with the wean on pressure support continue pulmonary toilet titrate oxygen as above respiratory therapy to evaluate potential for extubation to BiPAP 2. Severe sepsis at baseline we will continue with present therapy 3. Acute renal failure due to ATN continue with supportive care 4. Seizure disorder no active seizures 5. Chronic asthma continue present management   I have personally seen and evaluated the patient, evaluated laboratory and imaging results, formulated the assessment and plan and placed orders. The Patient requires high complexity decision making for assessment and support.  Case was discussed on Rounds with the Respiratory Therapy Staff  Yevonne PaxSaadat A Karime Scheuermann, MD [facility] Pulmonary Critical Care Medicine Sleep Medicine

## 2018-01-28 DIAGNOSIS — J449 Chronic obstructive pulmonary disease, unspecified: Secondary | ICD-10-CM | POA: Diagnosis not present

## 2018-01-28 DIAGNOSIS — Z978 Presence of other specified devices: Secondary | ICD-10-CM | POA: Diagnosis not present

## 2018-01-28 DIAGNOSIS — J9621 Acute and chronic respiratory failure with hypoxia: Secondary | ICD-10-CM | POA: Diagnosis not present

## 2018-01-28 DIAGNOSIS — N170 Acute kidney failure with tubular necrosis: Secondary | ICD-10-CM | POA: Diagnosis not present

## 2018-01-28 LAB — BASIC METABOLIC PANEL
ANION GAP: 8 (ref 5–15)
BUN: 24 mg/dL — ABNORMAL HIGH (ref 8–23)
CO2: 30 mmol/L (ref 22–32)
CREATININE: 1.02 mg/dL (ref 0.61–1.24)
Calcium: 9 mg/dL (ref 8.9–10.3)
Chloride: 101 mmol/L (ref 98–111)
GFR calc Af Amer: >60 mL/min (ref >60)
GFR calc non Af Amer: >60 mL/min (ref >60)
Glucose, Bld: 114 mg/dL — ABNORMAL HIGH (ref 70–99)
Potassium: 4.6 mmol/L (ref 3.5–5.1)
Sodium: 139 mmol/L (ref 135–145)

## 2018-01-28 NOTE — Progress Notes (Signed)
Pulmonary Critical Care Medicine [facility] [place]   PULMONARY CRITICAL CARE SERVICE  PROGRESS NOTE  Date of Service: 01/28/2018  Joseph Patterson  [id number]  DOB: [birth year]   DOA: 01/13/2018  Referring Physician: Carron Curie, MD  HPI: Joseph Patterson is a 63 y.o. male seen for follow up of Acute on Chronic Respiratory Failure.  Patient is weaning on pressure support.  He was not extubated.  The patient has not had a good cuff leak and likely has upper airway edema.  We are waiting for ENT to do the tracheostomy.   Medications: Reviewed on Rounds  Physical Exam:  Vitals: Temperature 98.4 pulse 78 respiratory 18 blood pressure 110/72 saturations 96%  Ventilator Settings mode ventilation pressure support FiO2 28% tidal volume 412 pressure support 12 PEEP 5  . General: Comfortable at this time . Eyes: Grossly normal lids, irises & conjunctiva . ENT: grossly tongue is normal . Neck: no obvious mass . Cardiovascular: S1 S2 normal no gallop . Respiratory: No rhonchi or rales are noted at this time . Abdomen: soft . Skin: no rash seen on limited exam . Musculoskeletal: not rigid . Psychiatric:unable to assess . Neurologic: no seizure no involuntary movements         Lab Data:   Basic Metabolic Panel: Recent Labs  Lab 01/22/18 0536 01/25/18 0639 01/28/18 0531  NA 135 137 139  K 4.2 4.2 4.6  CL 100 100 101  CO2 27 28 30   GLUCOSE 112* 96 114*  BUN 19 20 24*  CREATININE 0.94 0.97 1.02  CALCIUM 8.6* 9.0 9.0    ABG: Recent Labs  Lab 01/24/18 1015  PHART 7.454*  PCO2ART 40.8  PO2ART 83.9  HCO3 28.2*  O2SAT 97.8    Liver Function Tests: No results for input(s): AST, ALT, ALKPHOS, BILITOT, PROT, ALBUMIN in the last 168 hours. No results for input(s): LIPASE, AMYLASE in the last 168 hours. No results for input(s): AMMONIA in the last 168 hours.  CBC: No results for input(s): WBC, NEUTROABS, HGB, HCT, MCV, PLT in the last 168 hours.  Cardiac  Enzymes: No results for input(s): CKTOTAL, CKMB, CKMBINDEX, TROPONINI in the last 168 hours.  BNP (last 3 results) No results for input(s): BNP in the last 8760 hours.  ProBNP (last 3 results) No results for input(s): PROBNP in the last 8760 hours.  Radiological Exams: No results found.  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 Acute on chronic respiratory failure with hypoxia we will continue with pressure support mode titrate oxygen as tolerated continue pulmonary toilet supportive care.  Await ENT input for tracheostomy 2. Severe sepsis hemodynamically stable 3. 8 seizure disorder no active seizures 4. Chronic asthma at baseline we will continue present management. 5. Acute renal failure resolved   I have personally seen and evaluated the patient, evaluated laboratory and imaging results, formulated the assessment and plan and placed orders. The Patient requires high complexity decision making for assessment and support.  Case was discussed on Rounds with the Respiratory Therapy Staff  Yevonne Pax, MD [facility] Pulmonary Critical Care Medicine Sleep Medicine

## 2018-01-29 DIAGNOSIS — Z978 Presence of other specified devices: Secondary | ICD-10-CM | POA: Diagnosis not present

## 2018-01-29 DIAGNOSIS — N170 Acute kidney failure with tubular necrosis: Secondary | ICD-10-CM | POA: Diagnosis not present

## 2018-01-29 DIAGNOSIS — J9621 Acute and chronic respiratory failure with hypoxia: Secondary | ICD-10-CM | POA: Diagnosis not present

## 2018-01-29 DIAGNOSIS — J449 Chronic obstructive pulmonary disease, unspecified: Secondary | ICD-10-CM | POA: Diagnosis not present

## 2018-01-29 NOTE — Progress Notes (Signed)
Pulmonary Critical Care Medicine [facility] [place]   PULMONARY CRITICAL CARE SERVICE  PROGRESS NOTE  Date of Service: 01/29/2018  Joseph Patterson  [id number]  DOB: 12-30-1955   DOA: 01/13/2018  Referring Physician: Carron Curie, MD  HPI: Joseph Patterson is a 63 y.o. male seen for follow up of Acute on Chronic Respiratory Failure.  Patient is on pressure support right now seen by ENT should have a tracheostomy done this week  Medications: Reviewed on Rounds  Physical Exam:  Vitals: Temperature 97.3 pulse 97 respiratory 26 blood pressure 147/74 saturations 94%  Ventilator Settings mode ventilation pressure support FiO2 28% tidal volume 390 pressure 12 PEEP 5  . General: Comfortable at this time . Eyes: Grossly normal lids, irises & conjunctiva . ENT: grossly tongue is normal . Neck: no obvious mass . Cardiovascular: S1 S2 normal no gallop . Respiratory: No rhonchi or rales are noted at this time . Abdomen: soft . Skin: no rash seen on limited exam . Musculoskeletal: not rigid . Psychiatric:unable to assess . Neurologic: no seizure no involuntary movements         Lab Data:   Basic Metabolic Panel: Recent Labs  Lab 01/25/18 0639 01/28/18 0531  NA 137 139  K 4.2 4.6  CL 100 101  CO2 28 30  GLUCOSE 96 114*  BUN 20 24*  CREATININE 0.97 1.02  CALCIUM 9.0 9.0    ABG: Recent Labs  Lab 01/24/18 1015  PHART 7.454*  PCO2ART 40.8  PO2ART 83.9  HCO3 28.2*  O2SAT 97.8    Liver Function Tests: No results for input(s): AST, ALT, ALKPHOS, BILITOT, PROT, ALBUMIN in the last 168 hours. No results for input(s): LIPASE, AMYLASE in the last 168 hours. No results for input(s): AMMONIA in the last 168 hours.  CBC: No results for input(s): WBC, NEUTROABS, HGB, HCT, MCV, PLT in the last 168 hours.  Cardiac Enzymes: No results for input(s): CKTOTAL, CKMB, CKMBINDEX, TROPONINI in the last 168 hours.  BNP (last 3 results) No results for input(s): BNP in the  last 8760 hours.  ProBNP (last 3 results) No results for input(s): PROBNP in the last 8760 hours.  Radiological Exams: No results found.  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 Acute on chronic respiratory failure with hypoxia we will continue with the pressure support for now.  Until the tracheostomy is done then we can start weaning on T collar. 2. Severe sepsis resolved 3. Acute renal failure we will continue present management 4. Seizure disorder no active seizures 5. Chronic asthma stable at this time we will continue to monitor   I have personally seen and evaluated the patient, evaluated laboratory and imaging results, formulated the assessment and plan and placed orders. The Patient requires high complexity decision making for assessment and support.  Case was discussed on Rounds with the Respiratory Therapy Staff  Yevonne Pax, MD [facility] Pulmonary Critical Care Medicine Sleep Medicine

## 2018-01-30 ENCOUNTER — Encounter
Admission: AD | Disposition: A | Discharge status: Skilled Nursing Facility | Payer: Self-pay | Source: Other Acute Inpatient Hospital

## 2018-01-30 ENCOUNTER — Inpatient Hospital Stay: Admit: 2018-01-30 | Payer: Medicare Other | Admitting: Otolaryngology

## 2018-01-30 ENCOUNTER — Encounter (HOSPITAL_COMMUNITY): Payer: Medicaid Other | Admitting: Certified Registered Nurse Anesthetist

## 2018-01-30 ENCOUNTER — Inpatient Hospital Stay (HOSPITAL_COMMUNITY): Admission: RE | Admit: 2018-01-30 | Payer: Medicare Other | Source: Ambulatory Visit

## 2018-01-30 DIAGNOSIS — Z978 Presence of other specified devices: Secondary | ICD-10-CM | POA: Diagnosis not present

## 2018-01-30 DIAGNOSIS — N170 Acute kidney failure with tubular necrosis: Secondary | ICD-10-CM | POA: Diagnosis not present

## 2018-01-30 DIAGNOSIS — J449 Chronic obstructive pulmonary disease, unspecified: Secondary | ICD-10-CM | POA: Diagnosis not present

## 2018-01-30 DIAGNOSIS — J9621 Acute and chronic respiratory failure with hypoxia: Secondary | ICD-10-CM | POA: Diagnosis not present

## 2018-01-30 HISTORY — PX: TRACHEOSTOMY TUBE PLACEMENT: SHX814

## 2018-01-30 LAB — CBC
HCT: 34.8 % — ABNORMAL LOW (ref 39.0–52.0)
Hemoglobin: 10.6 g/dL — ABNORMAL LOW (ref 13.0–17.0)
MCH: 29.5 pg (ref 26.0–34.0)
MCHC: 30.5 g/dL (ref 30.0–36.0)
MCV: 96.9 fL (ref 80.0–100.0)
Platelets: 259 10*3/uL (ref 150–400)
RBC: 3.59 MIL/uL — ABNORMAL LOW (ref 4.22–5.81)
RDW: 15.2 % (ref 11.5–15.5)
WBC: 6.9 10*3/uL (ref 4.0–10.5)
nRBC: 0 % (ref 0.0–0.2)

## 2018-01-30 SURGERY — CREATION, TRACHEOSTOMY
Anesthesia: General

## 2018-01-30 MED ORDER — LIDOCAINE-EPINEPHRINE 1 %-1:100000 IJ SOLN
INTRAMUSCULAR | Status: DC | PRN
  Administered 2018-01-30: 4 mL

## 2018-01-30 MED ORDER — 0.9 % SODIUM CHLORIDE (POUR BTL) OPTIME
TOPICAL | Status: DC | PRN
  Administered 2018-01-30: 1000 mL

## 2018-01-30 MED ORDER — FENTANYL CITRATE (PF) 250 MCG/5ML IJ SOLN
INTRAMUSCULAR | Status: DC | PRN
  Administered 2018-01-30 (×5): 50 ug via INTRAVENOUS

## 2018-01-30 MED ORDER — PROPOFOL 10 MG/ML IV BOLUS
INTRAVENOUS | Status: AC
  Filled 2018-01-30: qty 20

## 2018-01-30 MED ORDER — MIDAZOLAM HCL 2 MG/2ML IJ SOLN
INTRAMUSCULAR | Status: AC
  Filled 2018-01-30: qty 2

## 2018-01-30 MED ORDER — LACTATED RINGERS IV SOLN
INTRAVENOUS | Status: DC | PRN
  Administered 2018-01-30: 10:00:00 via INTRAVENOUS

## 2018-01-30 MED ORDER — LIDOCAINE-EPINEPHRINE 1 %-1:100000 IJ SOLN
INTRAMUSCULAR | Status: AC
  Filled 2018-01-30: qty 1

## 2018-01-30 MED ORDER — FENTANYL CITRATE (PF) 250 MCG/5ML IJ SOLN
INTRAMUSCULAR | Status: AC
  Filled 2018-01-30: qty 5

## 2018-01-30 MED ORDER — PROPOFOL 1000 MG/100ML IV EMUL
INTRAVENOUS | Status: AC
  Filled 2018-01-30: qty 100

## 2018-01-30 MED ORDER — PHENYLEPHRINE HCL 10 MG/ML IJ SOLN
INTRAMUSCULAR | Status: DC | PRN
  Administered 2018-01-30 (×4): 80 ug via INTRAVENOUS

## 2018-01-30 MED ORDER — PROPOFOL 10 MG/ML IV BOLUS
INTRAVENOUS | Status: DC | PRN
  Administered 2018-01-30 (×2): 20 mg via INTRAVENOUS

## 2018-01-30 MED ORDER — DEXAMETHASONE SODIUM PHOSPHATE 10 MG/ML IJ SOLN
INTRAMUSCULAR | Status: DC | PRN
  Administered 2018-01-30: 10 mg via INTRAVENOUS

## 2018-01-30 MED ORDER — ONDANSETRON HCL 4 MG/2ML IJ SOLN
INTRAMUSCULAR | Status: DC | PRN
  Administered 2018-01-30: 4 mg via INTRAVENOUS

## 2018-01-30 MED ORDER — ROCURONIUM BROMIDE 10 MG/ML (PF) SYRINGE
PREFILLED_SYRINGE | INTRAVENOUS | Status: DC | PRN
  Administered 2018-01-30 (×2): 20 mg via INTRAVENOUS
  Administered 2018-01-30: 30 mg via INTRAVENOUS

## 2018-01-30 SURGICAL SUPPLY — 39 items
ATTRACTOMAT 16X20 MAGNETIC DRP (DRAPES) IMPLANT
BLADE SURG 15 STRL LF DISP TIS (BLADE) ×1 IMPLANT
BLADE SURG 15 STRL SS (BLADE) ×2
CLEANER TIP ELECTROSURG 2X2 (MISCELLANEOUS) ×3 IMPLANT
COVER SURGICAL LIGHT HANDLE (MISCELLANEOUS) ×3 IMPLANT
COVER WAND RF STERILE (DRAPES) ×3 IMPLANT
DRAPE HALF SHEET 40X57 (DRAPES) IMPLANT
ELECT COATED BLADE 2.86 ST (ELECTRODE) ×3 IMPLANT
ELECT REM PT RETURN 9FT ADLT (ELECTROSURGICAL) ×3
ELECTRODE REM PT RTRN 9FT ADLT (ELECTROSURGICAL) ×1 IMPLANT
GAUZE 4X4 16PLY RFD (DISPOSABLE) ×3 IMPLANT
GEL ULTRASOUND 20GR AQUASONIC (MISCELLANEOUS) ×3 IMPLANT
GLOVE SS BIOGEL STRL SZ 7.5 (GLOVE) ×1 IMPLANT
GLOVE SUPERSENSE BIOGEL SZ 7.5 (GLOVE) ×2
GOWN STRL REUS W/ TWL LRG LVL3 (GOWN DISPOSABLE) ×1 IMPLANT
GOWN STRL REUS W/ TWL XL LVL3 (GOWN DISPOSABLE) ×1 IMPLANT
GOWN STRL REUS W/TWL LRG LVL3 (GOWN DISPOSABLE) ×2
GOWN STRL REUS W/TWL XL LVL3 (GOWN DISPOSABLE) ×2
HOLDER TRACH TUBE VELCRO 19.5 (MISCELLANEOUS) ×3 IMPLANT
KIT BASIN OR (CUSTOM PROCEDURE TRAY) ×3 IMPLANT
KIT SUCTION CATH 14FR (SUCTIONS) ×3 IMPLANT
KIT TURNOVER KIT B (KITS) ×3 IMPLANT
NEEDLE HYPO 25GX1X1/2 BEV (NEEDLE) ×3 IMPLANT
NS IRRIG 1000ML POUR BTL (IV SOLUTION) ×3 IMPLANT
PACK EENT II TURBAN DRAPE (CUSTOM PROCEDURE TRAY) ×3 IMPLANT
PAD ARMBOARD 7.5X6 YLW CONV (MISCELLANEOUS) IMPLANT
PENCIL BUTTON HOLSTER BLD 10FT (ELECTRODE) ×3 IMPLANT
SPONGE DRAIN TRACH 4X4 STRL 2S (GAUZE/BANDAGES/DRESSINGS) ×3 IMPLANT
SPONGE INTESTINAL PEANUT (DISPOSABLE) ×3 IMPLANT
SUT SILK 2 0 SH CR/8 (SUTURE) ×3 IMPLANT
SUT SILK 3 0 TIES 10X30 (SUTURE) IMPLANT
SYR 5ML LUER SLIP (SYRINGE) ×3 IMPLANT
SYR CONTROL 10ML LL (SYRINGE) ×3 IMPLANT
TOWEL OR 17X24 6PK STRL BLUE (TOWEL DISPOSABLE) ×3 IMPLANT
TOWEL OR 17X26 10 PK STRL BLUE (TOWEL DISPOSABLE) ×3 IMPLANT
TUBE CONNECTING 12'X1/4 (SUCTIONS) ×1
TUBE CONNECTING 12X1/4 (SUCTIONS) ×2 IMPLANT
TUBE TRACH SHILEY  6 DIST  CUF (TUBING) ×3 IMPLANT
TUBE TRACH SHILEY 8 DIST CUF (TUBING) IMPLANT

## 2018-01-30 NOTE — Anesthesia Preprocedure Evaluation (Addendum)
Anesthesia Evaluation  Patient identified by MRN, date of birth, ID band Patient awake    Reviewed: Allergy & Precautions, NPO status , Patient's Chart, lab work & pertinent test results  Airway Mallampati: Intubated       Dental  (+) Edentulous Lower, Edentulous Upper   Pulmonary asthma , COPD,     + decreased breath sounds      Cardiovascular  Rhythm:Regular Rate:Normal     Neuro/Psych Seizures -,     GI/Hepatic negative GI ROS, Neg liver ROS,   Endo/Other  negative endocrine ROS  Renal/GU      Musculoskeletal   Abdominal Normal abdominal exam  (+)   Peds  Hematology   Anesthesia Other Findings   Reproductive/Obstetrics                            Anesthesia Physical Anesthesia Plan  ASA: IV  Anesthesia Plan: General   Post-op Pain Management:    Induction: Inhalational  PONV Risk Score and Plan: 2 and Ondansetron and Midazolam  Airway Management Planned: Oral ETT  Additional Equipment: None  Intra-op Plan:   Post-operative Plan: Extubation in OR  Informed Consent: I have reviewed the patients History and Physical, chart, labs and discussed the procedure including the risks, benefits and alternatives for the proposed anesthesia with the patient or authorized representative who has indicated his/her understanding and acceptance.       Plan Discussed with: CRNA  Anesthesia Plan Comments:        Anesthesia Quick Evaluation

## 2018-01-30 NOTE — Transfer of Care (Signed)
Immediate Anesthesia Transfer of Care Note  Patient: Joseph Patterson  Procedure(s) Performed: TRACHEOSTOMY (N/A )  Patient Location: select  Anesthesia Type:General  Level of Consciousness: Patient remains intubated per anesthesia plan  Airway & Oxygen Therapy: Patient remains intubated per anesthesia plan  Post-op Assessment: Report given to RN and Post -op Vital signs reviewed and stable  Post vital signs: Reviewed and stable  Last Vitals:  Vitals Value Taken Time  BP    Temp    Pulse    Resp    SpO2      Last Pain: There were no vitals filed for this visit.       Complications: No apparent anesthesia complications

## 2018-01-30 NOTE — Progress Notes (Signed)
Pulmonary Critical Care Medicine [facility] [place]   PULMONARY CRITICAL CARE SERVICE  PROGRESS NOTE  Date of Service: 01/30/2018  DRACE GATER  [id number]  DOB: Oct 29, 1955   DOA: 01/13/2018  Referring Physician: Carron Curie, MD  HPI: LAVERT MODENA is a 63 y.o. male seen for follow up of Acute on Chronic Respiratory Failure.  Patient has tracheostomy done today.  He is doing well.  His right now on full vent support will be reassessed again tomorrow for weaning.  Medications: Reviewed on Rounds  Physical Exam:  Vitals: Temperature 98.2 pulse 76 respiratory 11 blood pressure 100/66 saturations 98%  Ventilator Settings mode ventilation assist control FiO2 28% tidal volume 516 PEEP 5  . General: Comfortable at this time . Eyes: Grossly normal lids, irises & conjunctiva . ENT: grossly tongue is normal . Neck: no obvious mass . Cardiovascular: S1 S2 normal no gallop . Respiratory: No rhonchi or rales . Abdomen: soft . Skin: no rash seen on limited exam . Musculoskeletal: not rigid . Psychiatric:unable to assess . Neurologic: no seizure no involuntary movements         Lab Data:   Basic Metabolic Panel: Recent Labs  Lab 01/25/18 0639 01/28/18 0531  NA 137 139  K 4.2 4.6  CL 100 101  CO2 28 30  GLUCOSE 96 114*  BUN 20 24*  CREATININE 0.97 1.02  CALCIUM 9.0 9.0    ABG: Recent Labs  Lab 01/24/18 1015  PHART 7.454*  PCO2ART 40.8  PO2ART 83.9  HCO3 28.2*  O2SAT 97.8    Liver Function Tests: No results for input(s): AST, ALT, ALKPHOS, BILITOT, PROT, ALBUMIN in the last 168 hours. No results for input(s): LIPASE, AMYLASE in the last 168 hours. No results for input(s): AMMONIA in the last 168 hours.  CBC: Recent Labs  Lab 01/30/18 0940  WBC 6.9  HGB 10.6*  HCT 34.8*  MCV 96.9  PLT 259    Cardiac Enzymes: No results for input(s): CKTOTAL, CKMB, CKMBINDEX, TROPONINI in the last 168 hours.  BNP (last 3 results) No results for input(s):  BNP in the last 8760 hours.  ProBNP (last 3 results) No results for input(s): PROBNP in the last 8760 hours.  Radiological Exams: No results found.  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 Acute on chronic respiratory failure with hypoxia we will continue with the full vent support for the day.  Tomorrow we should be able to try to resume on pressure support wean. 2. Severe sepsis hemodynamically stable resolved 3. Acute renal failure labs are normal. 4. Seizure disorder no active seizures 5. Chronic asthma stable at this time continue to monitor   I have personally seen and evaluated the patient, evaluated laboratory and imaging results, formulated the assessment and plan and placed orders. The Patient requires high complexity decision making for assessment and support.  Case was discussed on Rounds with the Respiratory Therapy Staff  Yevonne Pax, MD [facility] Pulmonary Critical Care Medicine Sleep Medicine

## 2018-01-30 NOTE — Anesthesia Postprocedure Evaluation (Signed)
Anesthesia Post Note  Patient: Joseph Patterson  Procedure(s) Performed: TRACHEOSTOMY (N/A )     Patient location during evaluation: SICU Anesthesia Type: General Level of consciousness: sedated Pain management: pain level controlled Vital Signs Assessment: post-procedure vital signs reviewed and stable Respiratory status: patient on ventilator - see flowsheet for VS Cardiovascular status: stable Postop Assessment: no apparent nausea or vomiting Anesthetic complications: no    Last Vitals: There were no vitals filed for this visit.  Last Pain: There were no vitals filed for this visit.               Shelton Silvas

## 2018-01-30 NOTE — Op Note (Signed)
NAME: OTTO, SPAGNOLI MEDICAL RECORD HA:[id number] ACCOUNT [id number] DATE OF BIRTH:03-Apr-1955 FACILITY: [facility] LOCATION: [facility] PHYSICIAN:CHRISTOPHER Braxton Feathers, MD  OPERATIVE REPORT  DATE OF PROCEDURE:  01/30/2018  PREOPERATIVE DIAGNOSIS:  Acute on chronic respiratory failure.  POSTOPERATIVE DIAGNOSIS:  Acute on chronic respiratory failure.  OPERATION PERFORMED:  Tracheotomy with a #6 Shiley cuffed tube.  SURGEON:  Dillard Cannon, MD  ANESTHESIA:  General endotracheal.  ESTIMATED BLOOD LOSS:  Minimal.  COMPLICATIONS:  None.  BRIEF CLINICAL NOTE:  The patient is a 63 year old gentleman who has had a previous tracheotomy performed a year and a half ago.  More recently, he is a nursing home patient who has had previous history of a CVA with deficits including right hemiplegia,  neurogenic bladder, and has a suprapubic catheter.  Apparently, the patient developed sepsis secondary to a UTI at outside facility and was requiring hospitalization with emergent intubation in the ED that was very difficult with cardiac arrest.  He was  intubated and received IV antibiotics with resolution of his sepsis and was subsequently transferred to [facility], intubated and on a ventilator on 01/04 of this year.  Weaning has been very slow, and a tracheotomy was recommended.  The  patient has now been intubated for over 2 weeks at [facility], and pulmonary recommended performing a tracheotomy.  He was taken to the operating room at this time for tracheotomy.  DESCRIPTION OF PROCEDURE:  The patient was brought straight down from [facility] and remained in his bed.  The neck was extended with a roll underneath the shoulders.  The old incision of his previous tracheotomy was utilized.  This area  was injected with 4 mL of Xylocaine with epinephrine for local hemostasis.  The area was prepped with Betadine solution and draped in sterile towels.  A vertical  incision was made directly over the old scar tissue in the midline over the trachea.   Dissection was carried down through skin and subcutaneous tissue and the scar tissue with cautery.  The tissue was retracted laterally, and the trachea was identified.  A cricoid hook was placed through the cricoid cartilage and suspended the trachea  superiorly.  A horizontal tracheotomy was performed about 1 cm below the cricoid through scar tissue down to the trachea.  The endotracheal tube was removed, and a #6 cuffed Shiley tube was inserted without difficulty.  The patient was ventilated well.   There was no significant bleeding.  The tracheotomy tube was secured with 2-0 silk sutures x4 and Velcro trach collar around the neck.  The patient was subsequently transferred back to [facility]  LN/NUANCE  D:01/30/2018 T:01/30/2018 JOB:[id number]

## 2018-01-30 NOTE — H&P (Signed)
PREOPERATIVE H&P  Chief Complaint: Acute on chronic respiratory failure  HPI: Joseph Patterson is a 63 y.o. male who presents for evaluation of possible tracheostomy for acute respiratory failure.  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  Also has a neurogenic bladder with a super pubic catheter.  He recently had sepsis secondary to UTI requiring emergent intubation in the emergency department secondary to hypoxia.  Apparently they had difficulty with intubation and patient had cardiac arrest during intubation.  Patient also with history of chronic kidney disease, asthma, coronary artery disease and seizure disorder.  [facility] intubated and on vent.  Attempts at weaning have been unsuccessful and tracheotomy was recommended.  Patient is taken to the operating room at this time for tracheostomy.  Of note patient had a previous tracheotomy performed about a year and a half ago.  No past medical history on file.  Social History   Socioeconomic History  . Marital status: Single    Spouse name: Not on file  . Number of children: Not on file  . Years of education: Not on file  . Highest education level: Not on file  Occupational History  . Not on file  Social Needs  . Financial resource strain: Not on file  . Food insecurity:    Worry: Not on file    Inability: Not on file  . Transportation needs:    Medical: Not on file    Non-medical: Not on file  Tobacco Use  . Smoking status: Not on file  Substance and Sexual Activity  . Alcohol use: Not on file  . Drug use: Not on file  . Sexual activity: Not on file  Lifestyle  . Physical activity:    Days per week: Not on file    Minutes per session: Not on file  . Stress: Not on file  Relationships  . Social connections:    Talks on phone: Not on file    Gets together: Not on file    Attends religious  service: Not on file    Active member of club or organization: Not on file    Attends meetings of clubs or organizations: Not on file    Relationship status: Not on file  Other Topics Concern  . Not on file  Social History Narrative  . Not on file   No family history on file. Allergies not on file Prior to Admission medications   Not on File     Positive ROS: Negative  All other systems have been reviewed and were otherwise negative with the exception of those mentioned in the HPI and as above.  Physical Exam: There were no vitals filed for this visit.  General: Intubated and on vent.  Poorly responsive. Neck: No palpable adenopathy or thyroid nodules.  Well-healed old midline scar.  Trach palpably midline. Cardiovascular: Regular rate and rhythm, no murmur.  Respiratory: Clear to auscultation   Assessment/Plan: respirtorary failure Plan for Procedure(s): TRACHEOSTOMY   Dillard Cannon, MD 01/30/2018 9:51 AM

## 2018-01-30 NOTE — Brief Op Note (Signed)
01/30/2018  11:17 AM  PATIENT:  Joseph Patterson  63 y.o. male  PRE-OPERATIVE DIAGNOSIS:  respirtorary failure  POST-OPERATIVE DIAGNOSIS:  respirtorary failure  PROCEDURE:  Procedure(s): TRACHEOSTOMY (N/A)  # 6 Shiley cuffed  SURGEON:  Surgeon(s) and Role:    * Drema Halon, MD - Primary  PHYSICIAN ASSISTANT:   ASSISTANTS: none   ANESTHESIA:   general  EBL:  minimal   BLOOD ADMINISTERED:none  DRAINS: none   LOCAL MEDICATIONS USED:  XYLOCAINE 4 cc  SPECIMEN:  No Specimen  DISPOSITION OF SPECIMEN:  N/A  COUNTS:  YES  TOURNIQUET:  * No tourniquets in log *  DICTATION: .Other Dictation: Dictation Number [id number]  PLAN OF CARE: Discharge to home after PACU  PATIENT DISPOSITION:  PACU - hemodynamically stable.   Delay start of Pharmacological VTE agent (>24hrs) due to surgical blood loss or risk of bleeding: yes

## 2018-01-31 ENCOUNTER — Encounter (HOSPITAL_COMMUNITY): Payer: Self-pay | Admitting: Otolaryngology

## 2018-01-31 DIAGNOSIS — N170 Acute kidney failure with tubular necrosis: Secondary | ICD-10-CM | POA: Diagnosis not present

## 2018-01-31 DIAGNOSIS — J449 Chronic obstructive pulmonary disease, unspecified: Secondary | ICD-10-CM | POA: Diagnosis not present

## 2018-01-31 DIAGNOSIS — Z978 Presence of other specified devices: Secondary | ICD-10-CM | POA: Diagnosis not present

## 2018-01-31 DIAGNOSIS — J9621 Acute and chronic respiratory failure with hypoxia: Secondary | ICD-10-CM | POA: Diagnosis not present

## 2018-01-31 NOTE — Progress Notes (Signed)
Pulmonary Critical Care Medicine [facility] [place]   PULMONARY CRITICAL CARE SERVICE  PROGRESS NOTE  Date of Service: 01/31/2018  NICCOLAS STANSEL  VQX:[id number]  DOB: 08-31-1955   DOA: 01/13/2018  Referring Physician: Carron Curie, MD  HPI: CLEBURN GREIWE is a 63 y.o. male seen for follow up of Acute on Chronic Respiratory Failure.  Patient currently is on wean pressure support mode doing well.  Patient has been tolerating it without distress good volumes are noted.  Medications: Reviewed on Rounds  Physical Exam:  Vitals: Temperature 98.0 pulse 77 respiratory rate 15 blood pressure 96/61 saturations 97%  Ventilator Settings mode ventilation pressure support FiO2 28% tidal volume 639 per support 12 PEEP 5  . General: Comfortable at this time . Eyes: Grossly normal lids, irises & conjunctiva . ENT: grossly tongue is normal . Neck: no obvious mass . Cardiovascular: S1 S2 normal no gallop . Respiratory: No rhonchi or rales are noted at this time . Abdomen: soft . Skin: no rash seen on limited exam . Musculoskeletal: not rigid . Psychiatric:unable to assess . Neurologic: no seizure no involuntary movements         Lab Data:   Basic Metabolic Panel: Recent Labs  Lab 01/25/18 0639 01/28/18 0531  NA 137 139  K 4.2 4.6  CL 100 101  CO2 28 30  GLUCOSE 96 114*  BUN 20 24*  CREATININE 0.97 1.02  CALCIUM 9.0 9.0    ABG: No results for input(s): PHART, PCO2ART, PO2ART, HCO3, O2SAT in the last 168 hours.  Liver Function Tests: No results for input(s): AST, ALT, ALKPHOS, BILITOT, PROT, ALBUMIN in the last 168 hours. No results for input(s): LIPASE, AMYLASE in the last 168 hours. No results for input(s): AMMONIA in the last 168 hours.  CBC: Recent Labs  Lab 01/30/18 0940  WBC 6.9  HGB 10.6*  HCT 34.8*  MCV 96.9  PLT 259    Cardiac Enzymes: No results for input(s): CKTOTAL, CKMB, CKMBINDEX, TROPONINI in the last 168 hours.  BNP (last 3 results) No  results for input(s): BNP in the last 8760 hours.  ProBNP (last 3 results) No results for input(s): PROBNP in the last 8760 hours.  Radiological Exams: No results found.  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 Acute on chronic respiratory failure with hypoxia patient is doing well on the wean status post tracheostomy.  Suspect we should be able to proceed rapidly through the weaning protocol. 2. Severe sepsis resolved 3. Acute renal failure ATN resolved 4. Seizure disorder no active seizures 5. Chronic asthma at baseline we will continue with present management   I have personally seen and evaluated the patient, evaluated laboratory and imaging results, formulated the assessment and plan and placed orders. The Patient requires high complexity decision making for assessment and support.  Case was discussed on Rounds with the Respiratory Therapy Staff  Yevonne Pax, MD [facility] Pulmonary Critical Care Medicine Sleep Medicine

## 2018-02-01 DIAGNOSIS — J9621 Acute and chronic respiratory failure with hypoxia: Secondary | ICD-10-CM | POA: Diagnosis not present

## 2018-02-01 DIAGNOSIS — J449 Chronic obstructive pulmonary disease, unspecified: Secondary | ICD-10-CM | POA: Diagnosis not present

## 2018-02-01 DIAGNOSIS — Z978 Presence of other specified devices: Secondary | ICD-10-CM | POA: Diagnosis not present

## 2018-02-01 DIAGNOSIS — N170 Acute kidney failure with tubular necrosis: Secondary | ICD-10-CM | POA: Diagnosis not present

## 2018-02-01 NOTE — Progress Notes (Addendum)
[facility] [place]   PULMONARY CRITICAL CARE SERVICE  PROGRESS NOTE  Date of Service: 02/01/2018  Joseph Patterson Berhane  [id number]:[id number]  DOB: [birth year]   DOA: 01/13/2018  Referring Physician: Carron CurieAli Hijazi, MD  HPI: Joseph Patterson Reach is a 63 y.o. male seen for follow up of Acute on Chronic Respiratory Failure.  Patient is doing well today.  He was able to do 4 hours on trach collar at 30% FiO2.  He is currently on pressure support 12/5 which will stay on for 12 hours.  He appears to be tolerating this without distress.  Medications: Reviewed on Rounds  Physical Exam:  Vitals: Pulse 65 respirations 16 blood pressure 87/47 pulse ox 96% temp 98.9  Ventilator Settings pressure support 12/5 28% FiO2  . General: Comfortable at this time . Eyes: Grossly normal lids, irises & conjunctiva . ENT: grossly tongue is normal . Neck: no obvious mass . Cardiovascular: S1 S2 normal no gallop . Respiratory: Coarse breath sounds . Abdomen: soft . Skin: no rash seen on limited exam . Musculoskeletal: not rigid . Psychiatric:unable to assess . Neurologic: no seizure no involuntary movements         Lab Data:   Basic Metabolic Panel: Recent Labs  Lab 01/28/18 0531  NA 139  K 4.6  CL 101  CO2 30  GLUCOSE 114*  BUN 24*  CREATININE 1.02  CALCIUM 9.0    ABG: No results for input(s): PHART, PCO2ART, PO2ART, HCO3, O2SAT in the last 168 hours.  Liver Function Tests: No results for input(s): AST, ALT, ALKPHOS, BILITOT, PROT, ALBUMIN in the last 168 hours. No results for input(s): LIPASE, AMYLASE in the last 168 hours. No results for input(s): AMMONIA in the last 168 hours.  CBC: Recent Labs  Lab 01/30/18 0940  WBC 6.9  HGB 10.6*  HCT 34.8*  MCV 96.9  PLT 259    Cardiac Enzymes: No results for input(s): CKTOTAL, CKMB, CKMBINDEX, TROPONINI in the last 168 hours.  BNP (last 3 results) No results for input(s): BNP in the last 8760  hours.  ProBNP (last 3 results) No results for input(s): PROBNP in the last 8760 hours.  Radiological Exams: No results found.  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 Acute on chronic respiratory failure with hypoxia patient doing well and continues to wean per protocol.  He did 4 hours on trach collar today and will rest on the vent for 12 hours at a pressure support. 2. Severe sepsis resolved 3. Acute renal failure ATN resolved 4. Seizure disorder no active seizures 5. Chronic asthma at baseline continue with present management   I have personally seen and evaluated the patient, evaluated laboratory and imaging results, formulated the assessment and plan and placed orders. The Patient requires high complexity decision making for assessment and support.  Case was discussed on Rounds with the Respiratory Therapy Staff  Yevonne PaxSaadat A Serra Younan, MD [facility] Pulmonary Critical Care Medicine Sleep Medicine

## 2018-02-02 DIAGNOSIS — J9621 Acute and chronic respiratory failure with hypoxia: Secondary | ICD-10-CM | POA: Diagnosis not present

## 2018-02-02 DIAGNOSIS — Z978 Presence of other specified devices: Secondary | ICD-10-CM | POA: Diagnosis not present

## 2018-02-02 DIAGNOSIS — J449 Chronic obstructive pulmonary disease, unspecified: Secondary | ICD-10-CM | POA: Diagnosis not present

## 2018-02-02 DIAGNOSIS — N170 Acute kidney failure with tubular necrosis: Secondary | ICD-10-CM | POA: Diagnosis not present

## 2018-02-02 NOTE — Progress Notes (Signed)
Pulmonary Critical Care Medicine [facility] [place]   PULMONARY CRITICAL CARE SERVICE  PROGRESS NOTE  Date of Service: 02/02/2018  Joseph Patterson  [id number]  DOB: Sep 03, 1955   DOA: 01/13/2018  Referring Physician: Carron Curie, MD  HPI: Joseph Patterson is a 63 y.o. male seen for follow up of Acute on Chronic Respiratory Failure.  Patient is weaning currently on T collar is been on 28% FiO2 the goal is for 6 hours  Medications: Reviewed on Rounds  Physical Exam:  Vitals: Temperature 99.9 pulse 75 respiratory 16 blood pressure 100/55 saturations 97%  Ventilator Settings off the ventilator on T collar FiO2 28%  . General: Comfortable at this time . Eyes: Grossly normal lids, irises & conjunctiva . ENT: grossly tongue is normal . Neck: no obvious mass . Cardiovascular: S1 S2 normal no gallop . Respiratory: Scattered rhonchi expansion is equal . Abdomen: soft . Skin: no rash seen on limited exam . Musculoskeletal: not rigid . Psychiatric:unable to assess . Neurologic: no seizure no involuntary movements         Lab Data:   Basic Metabolic Panel: Recent Labs  Lab 01/28/18 0531  NA 139  K 4.6  CL 101  CO2 30  GLUCOSE 114*  BUN 24*  CREATININE 1.02  CALCIUM 9.0    ABG: No results for input(s): PHART, PCO2ART, PO2ART, HCO3, O2SAT in the last 168 hours.  Liver Function Tests: No results for input(s): AST, ALT, ALKPHOS, BILITOT, PROT, ALBUMIN in the last 168 hours. No results for input(s): LIPASE, AMYLASE in the last 168 hours. No results for input(s): AMMONIA in the last 168 hours.  CBC: Recent Labs  Lab 01/30/18 0940  WBC 6.9  HGB 10.6*  HCT 34.8*  MCV 96.9  PLT 259    Cardiac Enzymes: No results for input(s): CKTOTAL, CKMB, CKMBINDEX, TROPONINI in the last 168 hours.  BNP (last 3 results) No results for input(s): BNP in the last 8760 hours.  ProBNP (last 3 results) No results for input(s): PROBNP in the last 8760 hours.  Radiological  Exams: No results found.  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 Acute on chronic respiratory failure with hypoxia we will continue with weaning on T collar as mentioned above the goal is 6 hours 2. Severe sepsis resolved 3. Acute renal failure due to ATN improved 4. Seizure disorder no active seizures 5. Chronic obstructive asthma treated we will continue to monitor   I have personally seen and evaluated the patient, evaluated laboratory and imaging results, formulated the assessment and plan and placed orders. The Patient requires high complexity decision making for assessment and support.  Case was discussed on Rounds with the Respiratory Therapy Staff  Yevonne Pax, MD [facility] Pulmonary Critical Care Medicine Sleep Medicine

## 2018-02-03 DIAGNOSIS — Z978 Presence of other specified devices: Secondary | ICD-10-CM | POA: Diagnosis not present

## 2018-02-03 DIAGNOSIS — J449 Chronic obstructive pulmonary disease, unspecified: Secondary | ICD-10-CM | POA: Diagnosis not present

## 2018-02-03 DIAGNOSIS — N170 Acute kidney failure with tubular necrosis: Secondary | ICD-10-CM | POA: Diagnosis not present

## 2018-02-03 DIAGNOSIS — J9621 Acute and chronic respiratory failure with hypoxia: Secondary | ICD-10-CM | POA: Diagnosis not present

## 2018-02-03 NOTE — Progress Notes (Addendum)
Pulmonary Critical Care Medicine [facility] [place]   PULMONARY CRITICAL CARE SERVICE  PROGRESS NOTE  Date of Service: 02/03/2018  Joseph Patterson  [id number]  DOB: 01-23-1955   DOA: 01/13/2018  Referring Physician: Carron Curie, MD  HPI: Joseph Patterson is a 63 y.o. male seen for follow up of Acute on Chronic Respiratory Failure.  Patient is doing well on trach collar FiO2 28%.  His goal today is 12 hours and is doing well at this time.  He has small to moderate secretions noted.  Medications: Reviewed on Rounds  Physical Exam:  Vitals: Pulse 73 respirations 16 blood pressure 96/86 O2 sat 99% temp 97.9  Ventilator Settings patient is not currently on ventilator  . General: Comfortable at this time . Eyes: Grossly normal lids, irises & conjunctiva . ENT: grossly tongue is normal . Neck: no obvious mass . Cardiovascular: S1 S2 normal no gallop . Respiratory: Coarse breath sounds noted . Abdomen: soft . Skin: no rash seen on limited exam . Musculoskeletal: not rigid . Psychiatric:unable to assess . Neurologic: no seizure no involuntary movements         Lab Data:   Basic Metabolic Panel: Recent Labs  Lab 01/28/18 0531  NA 139  K 4.6  CL 101  CO2 30  GLUCOSE 114*  BUN 24*  CREATININE 1.02  CALCIUM 9.0    ABG: No results for input(s): PHART, PCO2ART, PO2ART, HCO3, O2SAT in the last 168 hours.  Liver Function Tests: No results for input(s): AST, ALT, ALKPHOS, BILITOT, PROT, ALBUMIN in the last 168 hours. No results for input(s): LIPASE, AMYLASE in the last 168 hours. No results for input(s): AMMONIA in the last 168 hours.  CBC: Recent Labs  Lab 01/30/18 0940  WBC 6.9  HGB 10.6*  HCT 34.8*  MCV 96.9  PLT 259    Cardiac Enzymes: No results for input(s): CKTOTAL, CKMB, CKMBINDEX, TROPONINI in the last 168 hours.  BNP (last 3 results) No results for input(s): BNP in the last 8760 hours.  ProBNP (last 3 results) No results for input(s):  PROBNP in the last 8760 hours.  Radiological Exams: No results found.  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 Acute on chronic respiratory failure with hypoxia we will continue with weaning on T collar as mentioned above the goal is 12 hours today. 2. Severe sepsis resolved 3. Acute renal failure due to ATN improved 4. Seizure disorder no active seizures 5. Chronic obstructive asthma treated continue to monitor   I have personally seen and evaluated the patient, evaluated laboratory and imaging results, formulated the assessment and plan and placed orders. The Patient requires high complexity decision making for assessment and support.  Case was discussed on Rounds with the Respiratory Therapy Staff  Yevonne Pax, MD [facility] Pulmonary Critical Care Medicine Sleep Medicine

## 2018-02-04 DIAGNOSIS — Z978 Presence of other specified devices: Secondary | ICD-10-CM | POA: Diagnosis not present

## 2018-02-04 DIAGNOSIS — J449 Chronic obstructive pulmonary disease, unspecified: Secondary | ICD-10-CM | POA: Diagnosis not present

## 2018-02-04 DIAGNOSIS — J9621 Acute and chronic respiratory failure with hypoxia: Secondary | ICD-10-CM | POA: Diagnosis not present

## 2018-02-04 DIAGNOSIS — N170 Acute kidney failure with tubular necrosis: Secondary | ICD-10-CM | POA: Diagnosis not present

## 2018-02-04 NOTE — Progress Notes (Addendum)
Pulmonary Critical Care Medicine [facility] [place]   PULMONARY CRITICAL CARE SERVICE  PROGRESS NOTE  Date of Service: 02/04/2018  Joseph Patterson  [id number]  DOB: 05/29/1955   DOA: 01/13/2018  Referring Physician: Carron Curie, MD  HPI: Joseph Patterson is a 63 y.o. male seen for follow up of Acute on Chronic Respiratory Failure.  Patient continues to do well on trach collar 28% FiO2.  His goal today is 16 hours on trach collar and is currently doing well.  Secretions are minimal.  Medications: Reviewed on Rounds  Physical Exam:  Vitals: Pulse 72 respirations 16 blood pressure 99/67 O2 sat 96% temp 97.1  Ventilator Settings patient not currently on ventilator  . General: Comfortable at this time . Eyes: Grossly normal lids, irises & conjunctiva . ENT: grossly tongue is normal . Neck: no obvious mass . Cardiovascular: S1 S2 normal no gallop . Respiratory: No wheezes or rhonchi noted . Abdomen: soft . Skin: no rash seen on limited exam . Musculoskeletal: not rigid . Psychiatric:unable to assess . Neurologic: no seizure no involuntary movements         Lab Data:   Basic Metabolic Panel: No results for input(s): NA, K, CL, CO2, GLUCOSE, BUN, CREATININE, CALCIUM, MG, PHOS in the last 168 hours.  ABG: No results for input(s): PHART, PCO2ART, PO2ART, HCO3, O2SAT in the last 168 hours.  Liver Function Tests: No results for input(s): AST, ALT, ALKPHOS, BILITOT, PROT, ALBUMIN in the last 168 hours. No results for input(s): LIPASE, AMYLASE in the last 168 hours. No results for input(s): AMMONIA in the last 168 hours.  CBC: Recent Labs  Lab 01/30/18 0940  WBC 6.9  HGB 10.6*  HCT 34.8*  MCV 96.9  PLT 259    Cardiac Enzymes: No results for input(s): CKTOTAL, CKMB, CKMBINDEX, TROPONINI in the last 168 hours.  BNP (last 3 results) No results for input(s): BNP in the last 8760 hours.  ProBNP (last 3 results) No results for input(s): PROBNP in the last 8760  hours.  Radiological Exams: No results found.  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 Acute on chronic respiratory failure with hypoxia continue weaning on trach collar as mentioned above with a 16-hour goal today. 2. Severe sepsis resolved 3. Acute renal failure due to ATN improved 4. Seizure disorder no active seizures 5. Chronic obstructive asthma continue to monitor   I have personally seen and evaluated the patient, evaluated laboratory and imaging results, formulated the assessment and plan and placed orders. The Patient requires high complexity decision making for assessment and support.  Case was discussed on Rounds with the Respiratory Therapy Staff  Yevonne Pax, MD [facility] Pulmonary Critical Care Medicine Sleep Medicine

## 2018-02-05 DIAGNOSIS — N170 Acute kidney failure with tubular necrosis: Secondary | ICD-10-CM | POA: Diagnosis not present

## 2018-02-05 DIAGNOSIS — J449 Chronic obstructive pulmonary disease, unspecified: Secondary | ICD-10-CM | POA: Diagnosis not present

## 2018-02-05 DIAGNOSIS — Z978 Presence of other specified devices: Secondary | ICD-10-CM | POA: Diagnosis not present

## 2018-02-05 DIAGNOSIS — J9621 Acute and chronic respiratory failure with hypoxia: Secondary | ICD-10-CM | POA: Diagnosis not present

## 2018-02-05 LAB — CBC
HCT: 34 % — ABNORMAL LOW (ref 39.0–52.0)
Hemoglobin: 10.4 g/dL — ABNORMAL LOW (ref 13.0–17.0)
MCH: 30 pg (ref 26.0–34.0)
MCHC: 30.6 g/dL (ref 30.0–36.0)
MCV: 98 fL (ref 80.0–100.0)
Platelets: 230 10*3/uL (ref 150–400)
RBC: 3.47 MIL/uL — ABNORMAL LOW (ref 4.22–5.81)
RDW: 15.2 % (ref 11.5–15.5)
WBC: 6 10*3/uL (ref 4.0–10.5)
nRBC: 0 % (ref 0.0–0.2)

## 2018-02-05 LAB — BASIC METABOLIC PANEL
Anion gap: 10 (ref 5–15)
BUN: 29 mg/dL — ABNORMAL HIGH (ref 8–23)
CO2: 25 mmol/L (ref 22–32)
Calcium: 8.9 mg/dL (ref 8.9–10.3)
Chloride: 105 mmol/L (ref 98–111)
Creatinine, Ser: 0.98 mg/dL (ref 0.61–1.24)
GFR calc Af Amer: >60 mL/min (ref >60)
GFR calc non Af Amer: >60 mL/min (ref >60)
Glucose, Bld: 108 mg/dL — ABNORMAL HIGH (ref 70–99)
Potassium: 4.5 mmol/L (ref 3.5–5.1)
Sodium: 140 mmol/L (ref 135–145)

## 2018-02-05 NOTE — Progress Notes (Signed)
Pulmonary Critical Care Medicine [facility] [place]   PULMONARY CRITICAL CARE SERVICE  PROGRESS NOTE  Date of Service: 02/05/2018  Joseph Patterson  [id number]  DOB: 08-13-1955   DOA: 01/13/2018  Referring Physician: Carron Curie, MD  HPI: Joseph Patterson is a 63 y.o. male seen for follow up of Acute on Chronic Respiratory Failure.  Patient is doing fairly well has been on T collar right now is on 28% oxygen the goal is for 20 hours  Medications: Reviewed on Rounds  Physical Exam:  Vitals: Temperature 96.6 pulse 56 respiratory 18 blood pressure 02/02/1957 saturations 100%  Ventilator Settings off the ventilator on T collar  . General: Comfortable at this time . Eyes: Grossly normal lids, irises & conjunctiva . ENT: grossly tongue is normal . Neck: no obvious mass . Cardiovascular: S1 S2 normal no gallop . Respiratory: Coarse rhonchi expansion equal . Abdomen: soft . Skin: no rash seen on limited exam . Musculoskeletal: not rigid . Psychiatric:unable to assess . Neurologic: no seizure no involuntary movements         Lab Data:   Basic Metabolic Panel: Recent Labs  Lab 02/05/18 0520  NA 140  K 4.5  CL 105  CO2 25  GLUCOSE 108*  BUN 29*  CREATININE 0.98  CALCIUM 8.9    ABG: No results for input(s): PHART, PCO2ART, PO2ART, HCO3, O2SAT in the last 168 hours.  Liver Function Tests: No results for input(s): AST, ALT, ALKPHOS, BILITOT, PROT, ALBUMIN in the last 168 hours. No results for input(s): LIPASE, AMYLASE in the last 168 hours. No results for input(s): AMMONIA in the last 168 hours.  CBC: Recent Labs  Lab 01/30/18 0940 02/05/18 0520  WBC 6.9 6.0  HGB 10.6* 10.4*  HCT 34.8* 34.0*  MCV 96.9 98.0  PLT 259 230    Cardiac Enzymes: No results for input(s): CKTOTAL, CKMB, CKMBINDEX, TROPONINI in the last 168 hours.  BNP (last 3 results) No results for input(s): BNP in the last 8760 hours.  ProBNP (last 3 results) No results for input(s):  PROBNP in the last 8760 hours.  Radiological Exams: No results found.  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 Acute on chronic respiratory failure with hypoxia we will continue to wean as mentioned the goal is for 20 hours on T collar 2. Severe sepsis hemodynamically stable 3. Acute renal failure continue to monitor labs. 4. Seizure disorder no active seizures 5. Chronic asthma stable   I have personally seen and evaluated the patient, evaluated laboratory and imaging results, formulated the assessment and plan and placed orders. The Patient requires high complexity decision making for assessment and support.  Case was discussed on Rounds with the Respiratory Therapy Staff  Yevonne Pax, MD [facility] Pulmonary Critical Care Medicine Sleep Medicine

## 2018-02-06 DIAGNOSIS — J449 Chronic obstructive pulmonary disease, unspecified: Secondary | ICD-10-CM | POA: Diagnosis not present

## 2018-02-06 DIAGNOSIS — Z978 Presence of other specified devices: Secondary | ICD-10-CM | POA: Diagnosis not present

## 2018-02-06 DIAGNOSIS — J9621 Acute and chronic respiratory failure with hypoxia: Secondary | ICD-10-CM | POA: Diagnosis not present

## 2018-02-06 DIAGNOSIS — N170 Acute kidney failure with tubular necrosis: Secondary | ICD-10-CM | POA: Diagnosis not present

## 2018-02-06 NOTE — Progress Notes (Addendum)
Pulmonary Critical Care Medicine [facility] [place]   PULMONARY CRITICAL CARE SERVICE  PROGRESS NOTE  Date of Service: 02/06/2018  Joseph Patterson  [id number]  DOB: [birth year]   DOA: 01/13/2018  Referring Physician: Carron Curie, MD  HPI: Joseph Patterson is a 63 y.o. male seen for follow up of Acute on Chronic Respiratory Failure.  Patient continues to do well on 28% trach collar with minimal secretions.  Goal is currently 48 hours.  Patient has been 24 hours with no difficulty and continues at this time.  Medications: Reviewed on Rounds  Physical Exam:  Vitals: Pulse 71 respirations 24 blood pressure 136/69 O2 sat 96% temp 96.8  Ventilator Settings patient's not currently on ventilator  . General: Comfortable at this time . Eyes: Grossly normal lids, irises & conjunctiva . ENT: grossly tongue is normal . Neck: no obvious mass . Cardiovascular: S1 S2 normal no gallop . Respiratory: Coarse breath sounds . Abdomen: soft . Skin: no rash seen on limited exam . Musculoskeletal: not rigid . Psychiatric:unable to assess . Neurologic: no seizure no involuntary movements         Lab Data:   Basic Metabolic Panel: Recent Labs  Lab 02/05/18 0520  NA 140  K 4.5  CL 105  CO2 25  GLUCOSE 108*  BUN 29*  CREATININE 0.98  CALCIUM 8.9    ABG: No results for input(s): PHART, PCO2ART, PO2ART, HCO3, O2SAT in the last 168 hours.  Liver Function Tests: No results for input(s): AST, ALT, ALKPHOS, BILITOT, PROT, ALBUMIN in the last 168 hours. No results for input(s): LIPASE, AMYLASE in the last 168 hours. No results for input(s): AMMONIA in the last 168 hours.  CBC: Recent Labs  Lab 02/05/18 0520  WBC 6.0  HGB 10.4*  HCT 34.0*  MCV 98.0  PLT 230    Cardiac Enzymes: No results for input(s): CKTOTAL, CKMB, CKMBINDEX, TROPONINI in the last 168 hours.  BNP (last 3 results) No results for input(s): BNP in the last 8760 hours.  ProBNP (last 3 results) No  results for input(s): PROBNP in the last 8760 hours.  Radiological Exams: No results found.  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 Acute on chronic respiratory failure with hypoxia continue to wean per protocol current goal is 48 hours.  Patient mains on trach collar 28% 2. Severe sepsis hemodynamically stable 3. Acute renal failure continue to monitor labs 4. Seizure disorder no active seizures 5. Chronic asthma stable   I have personally seen and evaluated the patient, evaluated laboratory and imaging results, formulated the assessment and plan and placed orders. The Patient requires high complexity decision making for assessment and support.  Case was discussed on Rounds with the Respiratory Therapy Staff  Yevonne Pax, MD [facility] Pulmonary Critical Care Medicine Sleep Medicine

## 2018-02-07 ENCOUNTER — Other Ambulatory Visit (HOSPITAL_COMMUNITY): Payer: Medicaid Other

## 2018-02-07 DIAGNOSIS — J449 Chronic obstructive pulmonary disease, unspecified: Secondary | ICD-10-CM | POA: Diagnosis not present

## 2018-02-07 DIAGNOSIS — J9621 Acute and chronic respiratory failure with hypoxia: Secondary | ICD-10-CM | POA: Diagnosis not present

## 2018-02-07 DIAGNOSIS — N170 Acute kidney failure with tubular necrosis: Secondary | ICD-10-CM | POA: Diagnosis not present

## 2018-02-07 DIAGNOSIS — Z978 Presence of other specified devices: Secondary | ICD-10-CM | POA: Diagnosis not present

## 2018-02-07 NOTE — Progress Notes (Addendum)
Pulmonary Critical Care Medicine [facility] [place]   PULMONARY CRITICAL CARE SERVICE  PROGRESS NOTE  Date of Service: 02/07/2018  Joseph Patterson  [id number]  DOB: [birth year]   DOA: 01/13/2018  Referring Physician: Carron Curie, MD  HPI: Joseph Patterson is a 63 y.o. male seen for follow up of Acute on Chronic Respiratory Failure.  Patient is now been 48 hours off the ventilator.  Remains on trach collar at 28%.  The cuff of his trach was deflated today and he seems to be doing well with this.  Does have some thick clear secretions noted.  Medications: Reviewed on Rounds  Physical Exam:  Vitals: 70 respirations 24 blood pressure 122/68 O2 sat 98% temp 97.3  Ventilator Settings patient's not currently on ventilator  . General: Comfortable at this time . Eyes: Grossly normal lids, irises & conjunctiva . ENT: grossly tongue is normal . Neck: no obvious mass . Cardiovascular: S1 S2 normal no gallop . Respiratory: Coarse breath sounds . Abdomen: soft . Skin: no rash seen on limited exam . Musculoskeletal: not rigid . Psychiatric:unable to assess . Neurologic: no seizure no involuntary movements         Lab Data:   Basic Metabolic Panel: Recent Labs  Lab 02/05/18 0520  NA 140  K 4.5  CL 105  CO2 25  GLUCOSE 108*  BUN 29*  CREATININE 0.98  CALCIUM 8.9    ABG: No results for input(s): PHART, PCO2ART, PO2ART, HCO3, O2SAT in the last 168 hours.  Liver Function Tests: No results for input(s): AST, ALT, ALKPHOS, BILITOT, PROT, ALBUMIN in the last 168 hours. No results for input(s): LIPASE, AMYLASE in the last 168 hours. No results for input(s): AMMONIA in the last 168 hours.  CBC: Recent Labs  Lab 02/05/18 0520  WBC 6.0  HGB 10.4*  HCT 34.0*  MCV 98.0  PLT 230    Cardiac Enzymes: No results for input(s): CKTOTAL, CKMB, CKMBINDEX, TROPONINI in the last 168 hours.  BNP (last 3 results) No results for input(s): BNP in the last 8760 hours.  ProBNP  (last 3 results) No results for input(s): PROBNP in the last 8760 hours.  Radiological Exams: Dg Chest Port 1 View  Result Date: 02/07/2018 CLINICAL DATA:  Pneumonia EXAM: PORTABLE CHEST 1 VIEW COMPARISON:  01/19/2018 FINDINGS: Tracheostomy tube is in place. The tip is 3.7 cm from the carina. Upper normal heart size. Low lung volumes. Bibasilar atelectasis. No pneumothorax or pleural effusion. Chronic left rib deformities. IMPRESSION: Tracheostomy tube. Bibasilar atelectasis. Electronically Signed   By: Jolaine Click M.D.   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 Acute on chronic respiratory failure with hypoxia continue weaning per protocol.  Continue trach collar 28%. 2. Severe sepsis hemodynamically stable 3. Acute renal failure continue to monitor labs 4. Seizure disorder no active seizures 5. Chronic asthma stable   I have personally seen and evaluated the patient, evaluated laboratory and imaging results, formulated the assessment and plan and placed orders. The Patient requires high complexity decision making for assessment and support.  Case was discussed on Rounds with the Respiratory Therapy Staff  Yevonne Pax, MD [facility] Pulmonary Critical Care Medicine Sleep Medicine

## 2018-02-08 DIAGNOSIS — J9621 Acute and chronic respiratory failure with hypoxia: Secondary | ICD-10-CM | POA: Diagnosis not present

## 2018-02-08 DIAGNOSIS — Z978 Presence of other specified devices: Secondary | ICD-10-CM | POA: Diagnosis not present

## 2018-02-08 DIAGNOSIS — J449 Chronic obstructive pulmonary disease, unspecified: Secondary | ICD-10-CM | POA: Diagnosis not present

## 2018-02-08 DIAGNOSIS — N170 Acute kidney failure with tubular necrosis: Secondary | ICD-10-CM | POA: Diagnosis not present

## 2018-02-08 NOTE — Progress Notes (Signed)
Pulmonary Critical Care Medicine [facility] [place]   PULMONARY CRITICAL CARE SERVICE  PROGRESS NOTE  Date of Service: 02/08/2018  Joseph Patterson  [id number]  DOB: 1955-11-21   DOA: 01/13/2018  Referring Physician: Carron Curie, MD  HPI: Joseph Patterson is a 63 y.o. male seen for follow up of Acute on Chronic Respiratory Failure.  Patient is on T collar is doing well with 28% FiO2 has been off the ventilator for more than 72 hours now.  We should be able to change to a cuffless trach today.  Only issue is thick secretions which will need to be monitored closely  Medications: Reviewed on Rounds  Physical Exam:  Vitals: Temperature 97.1 pulse 60 respiratory rate 10 blood pressure 119/75 saturations 100%  Ventilator Settings aerosolized T collar FiO2 28%  . General: Comfortable at this time . Eyes: Grossly normal lids, irises & conjunctiva . ENT: grossly tongue is normal . Neck: no obvious mass . Cardiovascular: S1 S2 normal no gallop . Respiratory: No rhonchi or rales are noted at this time . Abdomen: soft . Skin: no rash seen on limited exam . Musculoskeletal: not rigid . Psychiatric:unable to assess . Neurologic: no seizure no involuntary movements         Lab Data:   Basic Metabolic Panel: Recent Labs  Lab 02/05/18 0520  NA 140  K 4.5  CL 105  CO2 25  GLUCOSE 108*  BUN 29*  CREATININE 0.98  CALCIUM 8.9    ABG: No results for input(s): PHART, PCO2ART, PO2ART, HCO3, O2SAT in the last 168 hours.  Liver Function Tests: No results for input(s): AST, ALT, ALKPHOS, BILITOT, PROT, ALBUMIN in the last 168 hours. No results for input(s): LIPASE, AMYLASE in the last 168 hours. No results for input(s): AMMONIA in the last 168 hours.  CBC: Recent Labs  Lab 02/05/18 0520  WBC 6.0  HGB 10.4*  HCT 34.0*  MCV 98.0  PLT 230    Cardiac Enzymes: No results for input(s): CKTOTAL, CKMB, CKMBINDEX, TROPONINI in the last 168 hours.  BNP (last 3  results) No results for input(s): BNP in the last 8760 hours.  ProBNP (last 3 results) No results for input(s): PROBNP in the last 8760 hours.  Radiological Exams: Dg Chest Port 1 View  Result Date: 02/07/2018 CLINICAL DATA:  Pneumonia EXAM: PORTABLE CHEST 1 VIEW COMPARISON:  01/19/2018 FINDINGS: Tracheostomy tube is in place. The tip is 3.7 cm from the carina. Upper normal heart size. Low lung volumes. Bibasilar atelectasis. No pneumothorax or pleural effusion. Chronic left rib deformities. IMPRESSION: Tracheostomy tube. Bibasilar atelectasis. Electronically Signed   By: Jolaine Click M.D.   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 Acute on chronic respiratory failure with hypoxia we will continue with T collar trials continue secretion management pulmonary toilet change the trach over to a cuffless trach 2. Severe sepsis hemodynamically stable improved 3. Acute renal failure improving we will continue to monitor labs 4. Seizure disorder no active seizures are noted at this time. 5. Chronic asthma stable compensated   I have personally seen and evaluated the patient, evaluated laboratory and imaging results, formulated the assessment and plan and placed orders. The Patient requires high complexity decision making for assessment  and support.  Case was discussed on Rounds with the Respiratory Therapy Staff  Allyne Gee, MD [facility] Pulmonary Critical Care Medicine Sleep Medicine

## 2018-02-09 DIAGNOSIS — J9621 Acute and chronic respiratory failure with hypoxia: Secondary | ICD-10-CM | POA: Diagnosis not present

## 2018-02-09 DIAGNOSIS — J449 Chronic obstructive pulmonary disease, unspecified: Secondary | ICD-10-CM | POA: Diagnosis not present

## 2018-02-09 DIAGNOSIS — Z978 Presence of other specified devices: Secondary | ICD-10-CM | POA: Diagnosis not present

## 2018-02-09 DIAGNOSIS — N170 Acute kidney failure with tubular necrosis: Secondary | ICD-10-CM | POA: Diagnosis not present

## 2018-02-09 NOTE — Progress Notes (Signed)
Pulmonary Critical Care Medicine [facility] [place]   PULMONARY CRITICAL CARE SERVICE  PROGRESS NOTE  Date of Service: 02/09/2018  Joseph Patterson  [id number]  DOB: 1955/10/20   DOA: 01/13/2018  Referring Physician: Carron Curie, MD  HPI: Joseph Patterson is a 63 y.o. male seen for follow up of Acute on Chronic Respiratory Failure.  Patient is weaning doing fairly well right now is on T collar on 28% FiO2 sitting up in the chair  Medications: Reviewed on Rounds  Physical Exam:  Vitals: Temperature 97.3 pulse 78 respiratory 15 blood pressure 121/57 saturations 99%  Ventilator Settings off the ventilator on T collar  . General: Comfortable at this time . Eyes: Grossly normal lids, irises & conjunctiva . ENT: grossly tongue is normal . Neck: no obvious mass . Cardiovascular: S1 S2 normal no gallop . Respiratory: No rhonchi or rales are noted at this time . Abdomen: soft . Skin: no rash seen on limited exam . Musculoskeletal: not rigid . Psychiatric:unable to assess . Neurologic: no seizure no involuntary movements         Lab Data:   Basic Metabolic Panel: Recent Labs  Lab 02/05/18 0520  NA 140  K 4.5  CL 105  CO2 25  GLUCOSE 108*  BUN 29*  CREATININE 0.98  CALCIUM 8.9    ABG: No results for input(s): PHART, PCO2ART, PO2ART, HCO3, O2SAT in the last 168 hours.  Liver Function Tests: No results for input(s): AST, ALT, ALKPHOS, BILITOT, PROT, ALBUMIN in the last 168 hours. No results for input(s): LIPASE, AMYLASE in the last 168 hours. No results for input(s): AMMONIA in the last 168 hours.  CBC: Recent Labs  Lab 02/05/18 0520  WBC 6.0  HGB 10.4*  HCT 34.0*  MCV 98.0  PLT 230    Cardiac Enzymes: No results for input(s): CKTOTAL, CKMB, CKMBINDEX, TROPONINI in the last 168 hours.  BNP (last 3 results) No results for input(s): BNP in the last 8760 hours.  ProBNP (last 3 results) No results for input(s): PROBNP in the last 8760  hours.  Radiological Exams: Dg Chest Port 1 View  Result Date: 02/07/2018 CLINICAL DATA:  Pneumonia EXAM: PORTABLE CHEST 1 VIEW COMPARISON:  01/19/2018 FINDINGS: Tracheostomy tube is in place. The tip is 3.7 cm from the carina. Upper normal heart size. Low lung volumes. Bibasilar atelectasis. No pneumothorax or pleural effusion. Chronic left rib deformities. IMPRESSION: Tracheostomy tube. Bibasilar atelectasis. Electronically Signed   By: Jolaine Click M.D.   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 Acute on chronic respiratory failure hypoxia we will continue with T collar trials continue secretion management pulmonary toilet. 2. Severe sepsis resolved hemodynamically stable 3. Acute renal failure due to ATN treated we will continue to follow labs 4. Seizure disorder no active seizures 5. Chronic asthma at baseline continue with supportive care   I have personally seen and evaluated the patient, evaluated laboratory and imaging results, formulated the assessment and plan and placed orders. The Patient requires high complexity decision making for assessment and support.  Case was discussed on Rounds with the Respiratory Therapy Staff  Yevonne Pax, MD [facility] Pulmonary Critical Care Medicine Sleep Medicine

## 2018-02-10 DIAGNOSIS — Z978 Presence of other specified devices: Secondary | ICD-10-CM | POA: Diagnosis not present

## 2018-02-10 DIAGNOSIS — N170 Acute kidney failure with tubular necrosis: Secondary | ICD-10-CM | POA: Diagnosis not present

## 2018-02-10 DIAGNOSIS — J9621 Acute and chronic respiratory failure with hypoxia: Secondary | ICD-10-CM | POA: Diagnosis not present

## 2018-02-10 DIAGNOSIS — J449 Chronic obstructive pulmonary disease, unspecified: Secondary | ICD-10-CM | POA: Diagnosis not present

## 2018-02-10 NOTE — Progress Notes (Addendum)
Pulmonary Critical Care Medicine [facility] [place]   PULMONARY CRITICAL CARE SERVICE  PROGRESS NOTE  Date of Service: 02/10/2018  Joseph Patterson  [id number]  DOB: 1955/08/07   DOA: 01/13/2018  Referring Physician: Carron Curie, MD  HPI: Joseph Patterson is a 63 y.o. male seen for follow up of Acute on Chronic Respiratory Failure.  Patient continues to do well on trach collar 28% FiO2.  Patient sitting up in chair and doing well.  Medications: Reviewed on Rounds  Physical Exam:  Vitals: Pulse 65 respirations 18 BP 110/58 O2 sat 98% temp 98.1  Ventilator Settings patient's not currently on ventilator  . General: Comfortable at this time . Eyes: Grossly normal lids, irises & conjunctiva . ENT: grossly tongue is normal . Neck: no obvious mass . Cardiovascular: S1 S2 normal no gallop . Respiratory: No rhonchi rales noted at this time . Abdomen: soft . Skin: no rash seen on limited exam . Musculoskeletal: not rigid . Psychiatric:unable to assess . Neurologic: no seizure no involuntary movements         Lab Data:   Basic Metabolic Panel: Recent Labs  Lab 02/05/18 0520  NA 140  K 4.5  CL 105  CO2 25  GLUCOSE 108*  BUN 29*  CREATININE 0.98  CALCIUM 8.9    ABG: No results for input(s): PHART, PCO2ART, PO2ART, HCO3, O2SAT in the last 168 hours.  Liver Function Tests: No results for input(s): AST, ALT, ALKPHOS, BILITOT, PROT, ALBUMIN in the last 168 hours. No results for input(s): LIPASE, AMYLASE in the last 168 hours. No results for input(s): AMMONIA in the last 168 hours.  CBC: Recent Labs  Lab 02/05/18 0520  WBC 6.0  HGB 10.4*  HCT 34.0*  MCV 98.0  PLT 230    Cardiac Enzymes: No results for input(s): CKTOTAL, CKMB, CKMBINDEX, TROPONINI in the last 168 hours.  BNP (last 3 results) No results for input(s): BNP in the last 8760 hours.  ProBNP (last 3 results) No results for input(s): PROBNP in the last 8760 hours.  Radiological Exams: No  results found.  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 Acute on chronic respiratory failure with hypoxia continue trach collar trials as well as secretion management and pulmonary toilet. 2. Severe sepsis resolved hemodynamically stable 3. Acute renal failure due to ATN treated continue to follow labs 4. Seizure disorder no active seizures 5. Chronic asthma at baseline continue supportive care   I have personally seen and evaluated the patient, evaluated laboratory and imaging results, formulated the assessment and plan and placed orders. The Patient requires high complexity decision making for assessment and support.  Case was discussed on Rounds with the Respiratory Therapy Staff  Yevonne Pax, MD [facility] Pulmonary Critical Care Medicine Sleep Medicine

## 2018-02-11 DIAGNOSIS — N170 Acute kidney failure with tubular necrosis: Secondary | ICD-10-CM | POA: Diagnosis not present

## 2018-02-11 DIAGNOSIS — Z978 Presence of other specified devices: Secondary | ICD-10-CM | POA: Diagnosis not present

## 2018-02-11 DIAGNOSIS — J9621 Acute and chronic respiratory failure with hypoxia: Secondary | ICD-10-CM | POA: Diagnosis not present

## 2018-02-11 DIAGNOSIS — J449 Chronic obstructive pulmonary disease, unspecified: Secondary | ICD-10-CM | POA: Diagnosis not present

## 2018-02-11 NOTE — Progress Notes (Addendum)
Pulmonary Critical Care Medicine [facility] [place]   PULMONARY CRITICAL CARE SERVICE  PROGRESS NOTE  Date of Service: 02/11/2018  Joseph Patterson  [id number]  DOB: 1955-02-06   DOA: 01/13/2018  Referring Physician: Carron Curie, MD  HPI: Joseph Patterson is a 63 y.o. male seen for follow up of Acute on Chronic Respiratory Failure.  Patient continues to do well 28% FiO2 on trach collar.  Minimal secretions noted  Medications: Reviewed on Rounds  Physical Exam:  Vitals: Pulse 68 respirations 18 BP 109/74 O2 sat 98% temp 98.1  Ventilator Settings patient's not currently on ventilator  . General: Comfortable at this time . Eyes: Grossly normal lids, irises & conjunctiva . ENT: grossly tongue is normal . Neck: no obvious mass . Cardiovascular: S1 S2 normal no gallop . Respiratory: No wheezes or rales noted . Abdomen: soft . Skin: no rash seen on limited exam . Musculoskeletal: not rigid . Psychiatric:unable to assess . Neurologic: no seizure no involuntary movements         Lab Data:   Basic Metabolic Panel: Recent Labs  Lab 02/05/18 0520  NA 140  K 4.5  CL 105  CO2 25  GLUCOSE 108*  BUN 29*  CREATININE 0.98  CALCIUM 8.9    ABG: No results for input(s): PHART, PCO2ART, PO2ART, HCO3, O2SAT in the last 168 hours.  Liver Function Tests: No results for input(s): AST, ALT, ALKPHOS, BILITOT, PROT, ALBUMIN in the last 168 hours. No results for input(s): LIPASE, AMYLASE in the last 168 hours. No results for input(s): AMMONIA in the last 168 hours.  CBC: Recent Labs  Lab 02/05/18 0520  WBC 6.0  HGB 10.4*  HCT 34.0*  MCV 98.0  PLT 230    Cardiac Enzymes: No results for input(s): CKTOTAL, CKMB, CKMBINDEX, TROPONINI in the last 168 hours.  BNP (last 3 results) No results for input(s): BNP in the last 8760 hours.  ProBNP (last 3 results) No results for input(s): PROBNP in the last 8760 hours.  Radiological Exams: No results  found.  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 Acute on chronic respiratory failure with hypoxia continue trach collar trials and weaning as tolerated.  Also continue secretion management and pulmonary toilet. 2. Severe sepsis resolved hemodynamically stable 3. Acute renal failure due to ATN.  Continue to follow labs 4. Seizure disorder no active seizures 5. Chronic asthma at baseline continue supportive care   I have personally seen and evaluated the patient, evaluated laboratory and imaging results, formulated the assessment and plan and placed orders. The Patient requires high complexity decision making for assessment and support.  Case was discussed on Rounds with the Respiratory Therapy Staff  Yevonne Pax, MD [facility] Pulmonary Critical Care Medicine Sleep Medicine

## 2018-02-12 DIAGNOSIS — Z978 Presence of other specified devices: Secondary | ICD-10-CM | POA: Diagnosis not present

## 2018-02-12 DIAGNOSIS — N170 Acute kidney failure with tubular necrosis: Secondary | ICD-10-CM | POA: Diagnosis not present

## 2018-02-12 DIAGNOSIS — J449 Chronic obstructive pulmonary disease, unspecified: Secondary | ICD-10-CM | POA: Diagnosis not present

## 2018-02-12 DIAGNOSIS — J9621 Acute and chronic respiratory failure with hypoxia: Secondary | ICD-10-CM | POA: Diagnosis not present

## 2018-02-12 LAB — CBC
HCT: 40.7 % (ref 39.0–52.0)
Hemoglobin: 12.2 g/dL — ABNORMAL LOW (ref 13.0–17.0)
MCH: 29.6 pg (ref 26.0–34.0)
MCHC: 30 g/dL (ref 30.0–36.0)
MCV: 98.8 fL (ref 80.0–100.0)
PLATELETS: 221 10*3/uL (ref 150–400)
RBC: 4.12 MIL/uL — ABNORMAL LOW (ref 4.22–5.81)
RDW: 16.1 % — ABNORMAL HIGH (ref 11.5–15.5)
WBC: 7.9 10*3/uL (ref 4.0–10.5)
nRBC: 0 % (ref 0.0–0.2)

## 2018-02-12 NOTE — Progress Notes (Signed)
Pulmonary Critical Care Medicine [facility] [place]   PULMONARY CRITICAL CARE SERVICE  PROGRESS NOTE  Date of Service: 02/12/2018  Joseph Patterson  UXN:[id number]  DOB: February 24, 1955   DOA: 01/13/2018  Referring Physician: Carron Curie, MD  HPI: Joseph Patterson is a 63 y.o. male seen for follow up of Acute on Chronic Respiratory Failure.  Patient currently is on T collar has been on 28% oxygen secretions are still copious patient is however tolerating the PMV.  Suspect that patient has not been to be able to be decannulated secondary to the copious secretions  Medications: Reviewed on Rounds  Physical Exam:  Vitals: Temperature 97.4 pulse 60 respiratory 18 blood pressure 102/59 saturations 96%  Ventilator Settings currently off the ventilator on T collar FiO2 28% with the PMV in place  . General: Comfortable at this time . Eyes: Grossly normal lids, irises & conjunctiva . ENT: grossly tongue is normal . Neck: no obvious mass . Cardiovascular: S1 S2 normal no gallop . Respiratory: No rhonchi or rales are noted at this time . Abdomen: soft . Skin: no rash seen on limited exam . Musculoskeletal: not rigid . Psychiatric:unable to assess . Neurologic: no seizure no involuntary movements         Lab Data:   Basic Metabolic Panel: No results for input(s): NA, K, CL, CO2, GLUCOSE, BUN, CREATININE, CALCIUM, MG, PHOS in the last 168 hours.  ABG: No results for input(s): PHART, PCO2ART, PO2ART, HCO3, O2SAT in the last 168 hours.  Liver Function Tests: No results for input(s): AST, ALT, ALKPHOS, BILITOT, PROT, ALBUMIN in the last 168 hours. No results for input(s): LIPASE, AMYLASE in the last 168 hours. No results for input(s): AMMONIA in the last 168 hours.  CBC: Recent Labs  Lab 02/12/18 0539  WBC 7.9  HGB 12.2*  HCT 40.7  MCV 98.8  PLT 221    Cardiac Enzymes: No results for input(s): CKTOTAL, CKMB, CKMBINDEX, TROPONINI in the last 168 hours.  BNP (last 3  results) No results for input(s): BNP in the last 8760 hours.  ProBNP (last 3 results) No results for input(s): PROBNP in the last 8760 hours.  Radiological Exams: No results found.  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 Acute on chronic respiratory failure with hypoxia we will continue with T collar titrate oxygen continue pulmonary toilet supportive care. 2. Severe sepsis at baseline we will continue present management 3. Acute renal failure clinically improved 4. Seizure disorder no active seizures 5. Chronic asthma stable we will continue to monitor   I have personally seen and evaluated the patient, evaluated laboratory and imaging results, formulated the assessment and plan and placed orders. The Patient requires high complexity decision making for assessment and support.  Case was discussed on Rounds with the Respiratory Therapy Staff  Yevonne Pax, MD [facility] Pulmonary Critical Care Medicine Sleep Medicine

## 2018-02-13 DIAGNOSIS — Z978 Presence of other specified devices: Secondary | ICD-10-CM | POA: Diagnosis not present

## 2018-02-13 DIAGNOSIS — J449 Chronic obstructive pulmonary disease, unspecified: Secondary | ICD-10-CM | POA: Diagnosis not present

## 2018-02-13 DIAGNOSIS — N170 Acute kidney failure with tubular necrosis: Secondary | ICD-10-CM | POA: Diagnosis not present

## 2018-02-13 DIAGNOSIS — J9621 Acute and chronic respiratory failure with hypoxia: Secondary | ICD-10-CM | POA: Diagnosis not present

## 2018-02-13 LAB — C DIFFICILE QUICK SCREEN W PCR REFLEX
C DIFFICILE (CDIFF) TOXIN: NEGATIVE
C Diff antigen: NEGATIVE
C Diff interpretation: NOT DETECTED

## 2018-02-13 NOTE — Progress Notes (Signed)
[facility] [place]   PULMONARY CRITICAL CARE SERVICE  PROGRESS NOTE  Date of Service: 02/13/2018  Joseph Patterson  [id number]  DOB: May 16, 1955   DOA: 01/13/2018  Referring Physician: Carron CurieAli Hijazi, MD  HPI: Joseph Patterson is a 63 y.o. male seen for follow up of Acute on Chronic Respiratory Failure.  Patient is on T collar is remaining comfortable right now without distress.  On 28% FiO2.  Secretions are still very copious.  Medications: Reviewed on Rounds  Physical Exam:  Vitals: Temperature 98.4 pulse 78 respiratory 22 blood pressure 98/60 saturations 98%  Ventilator Settings off the ventilator on T collar right now  . General: Comfortable at this time . Eyes: Grossly normal lids, irises & conjunctiva . ENT: grossly tongue is normal . Neck: no obvious mass . Cardiovascular: S1 S2 normal no gallop . Respiratory: Scattered rhonchi expansion is equal . Abdomen: soft . Skin: no rash seen on limited exam . Musculoskeletal: not rigid . Psychiatric:unable to assess . Neurologic: no seizure no involuntary movements         Lab Data:   Basic Metabolic Panel: No results for input(s): NA, K, CL, CO2, GLUCOSE, BUN, CREATININE, CALCIUM, MG, PHOS in the last 168 hours.  ABG: No results for input(s): PHART, PCO2ART, PO2ART, HCO3, O2SAT in the last 168 hours.  Liver Function Tests: No results for input(s): AST, ALT, ALKPHOS, BILITOT, PROT, ALBUMIN in the last 168 hours. No results for input(s): LIPASE, AMYLASE in the last 168 hours. No results for input(s): AMMONIA in the last 168 hours.  CBC: Recent Labs  Lab 02/12/18 0539  WBC 7.9  HGB 12.2*  HCT 40.7  MCV 98.8  PLT 221    Cardiac Enzymes: No results for input(s): CKTOTAL, CKMB, CKMBINDEX, TROPONINI in the last 168 hours.  BNP (last 3 results) No results for input(s): BNP in the last 8760 hours.  ProBNP (last 3 results) No results for input(s): PROBNP in the last 8760  hours.  Radiological Exams: No results found.  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 Acute on chronic respiratory failure with hypoxia we will continue with T collar trials continue secretion management pulmonary toilet. 2. Severe sepsis resolved 3. Acute renal failure improved renal function 4. Seizure disorder no active seizures 5. Chronic asthma on therapy continue with supportive care   I have personally seen and evaluated the patient, evaluated laboratory and imaging results, formulated the assessment and plan and placed orders. The Patient requires high complexity decision making for assessment and support.  Case was discussed on Rounds with the Respiratory Therapy Staff  Yevonne PaxSaadat A Khan, MD  [facility] Pulmonary Critical Care Medicine Sleep Medicine

## 2018-02-14 DIAGNOSIS — J449 Chronic obstructive pulmonary disease, unspecified: Secondary | ICD-10-CM | POA: Diagnosis not present

## 2018-02-14 DIAGNOSIS — J9621 Acute and chronic respiratory failure with hypoxia: Secondary | ICD-10-CM | POA: Diagnosis not present

## 2018-02-14 DIAGNOSIS — N170 Acute kidney failure with tubular necrosis: Secondary | ICD-10-CM | POA: Diagnosis not present

## 2018-02-14 DIAGNOSIS — Z978 Presence of other specified devices: Secondary | ICD-10-CM | POA: Diagnosis not present

## 2018-02-14 LAB — URINE CULTURE: Culture: >=100000 — AB

## 2018-02-14 NOTE — Progress Notes (Signed)
Pulmonary Critical Care Medicine [facility] [place]   PULMONARY CRITICAL CARE SERVICE  PROGRESS NOTE  Date of Service: 02/14/2018  Joseph Patterson  [id number]  DOB: May 07, 1955   DOA: 01/13/2018  Referring Physician: Carron Curie, MD  HPI: Joseph Patterson is a 63 y.o. male seen for follow up of Acute on Chronic Respiratory Failure.  Patient is on T collar still has secretions that are significant.  Comfortable no fevers  Medications: Reviewed on Rounds  Physical Exam:  Vitals: Temperature 97.6 pulse 71 respiratory 18 blood pressure 98/57 saturation 98%  Ventilator Settings off the ventilator on T collar  . General: Comfortable at this time . Eyes: Grossly normal lids, irises & conjunctiva . ENT: grossly tongue is normal . Neck: no obvious mass . Cardiovascular: S1 S2 normal no gallop . Respiratory: Scattered rhonchi expansion is equal . Abdomen: soft . Skin: no rash seen on limited exam . Musculoskeletal: not rigid . Psychiatric:unable to assess . Neurologic: no seizure no involuntary movements         Lab Data:   Basic Metabolic Panel: No results for input(s): NA, K, CL, CO2, GLUCOSE, BUN, CREATININE, CALCIUM, MG, PHOS in the last 168 hours.  ABG: No results for input(s): PHART, PCO2ART, PO2ART, HCO3, O2SAT in the last 168 hours.  Liver Function Tests: No results for input(s): AST, ALT, ALKPHOS, BILITOT, PROT, ALBUMIN in the last 168 hours. No results for input(s): LIPASE, AMYLASE in the last 168 hours. No results for input(s): AMMONIA in the last 168 hours.  CBC: Recent Labs  Lab 02/12/18 0539  WBC 7.9  HGB 12.2*  HCT 40.7  MCV 98.8  PLT 221    Cardiac Enzymes: No results for input(s): CKTOTAL, CKMB, CKMBINDEX, TROPONINI in the last 168 hours.  BNP (last 3 results) No results for input(s): BNP in the last 8760 hours.  ProBNP (last 3 results) No results for input(s): PROBNP in the last 8760 hours.  Radiological Exams: No results  found.  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 Acute on chronic respiratory failure with hypoxia patient will continue weaning on T collar not able to do any capping at this point 2. Severe sepsis hemodynamically stable 3. Acute renal failure improving 4. Seizure disorder no active seizures 5. Chronic asthma we will continue with current therapy   I have personally seen and evaluated the patient, evaluated laboratory and imaging results, formulated the assessment and plan and placed orders. The Patient requires high complexity decision making for assessment and support.  Case was discussed on Rounds with the Respiratory Therapy Staff  Yevonne Pax, MD [facility] Pulmonary Critical Care Medicine Sleep Medicine

## 2018-02-15 DIAGNOSIS — J9621 Acute and chronic respiratory failure with hypoxia: Secondary | ICD-10-CM | POA: Diagnosis not present

## 2018-02-15 DIAGNOSIS — J449 Chronic obstructive pulmonary disease, unspecified: Secondary | ICD-10-CM | POA: Diagnosis not present

## 2018-02-15 DIAGNOSIS — Z978 Presence of other specified devices: Secondary | ICD-10-CM | POA: Diagnosis not present

## 2018-02-15 DIAGNOSIS — N170 Acute kidney failure with tubular necrosis: Secondary | ICD-10-CM | POA: Diagnosis not present

## 2018-02-15 NOTE — Progress Notes (Signed)
Pulmonary Critical Care Medicine [facility] [place]   PULMONARY CRITICAL CARE SERVICE  PROGRESS NOTE  Date of Service: 02/15/2018  BROM FLORIDA  [id number]  DOB: 04-Dec-1955   DOA: 01/13/2018  Referring Physician: Carron Curie, MD  HPI: Joseph Patterson is a 63 y.o. male seen for follow up of Acute on Chronic Respiratory Failure.  Patient is on T collar currently no distress at this time is on 28% oxygen tolerating PMV.  Patient's secretions are moderate still  Medications: Reviewed on Rounds  Physical Exam:  Vitals: Temperature 97.6 pulse 60 respiratory 20 blood pressure 91/66 saturations 98%  Ventilator Settings off the ventilator on T collar  . General: Comfortable at this time . Eyes: Grossly normal lids, irises & conjunctiva . ENT: grossly tongue is normal . Neck: no obvious mass . Cardiovascular: S1 S2 normal no gallop . Respiratory: Scattered rhonchi expansion is equal . Abdomen: soft . Skin: no rash seen on limited exam . Musculoskeletal: not rigid . Psychiatric:unable to assess . Neurologic: no seizure no involuntary movements         Lab Data:   Basic Metabolic Panel: No results for input(s): NA, K, CL, CO2, GLUCOSE, BUN, CREATININE, CALCIUM, MG, PHOS in the last 168 hours.  ABG: No results for input(s): PHART, PCO2ART, PO2ART, HCO3, O2SAT in the last 168 hours.  Liver Function Tests: No results for input(s): AST, ALT, ALKPHOS, BILITOT, PROT, ALBUMIN in the last 168 hours. No results for input(s): LIPASE, AMYLASE in the last 168 hours. No results for input(s): AMMONIA in the last 168 hours.  CBC: Recent Labs  Lab 02/12/18 0539  WBC 7.9  HGB 12.2*  HCT 40.7  MCV 98.8  PLT 221    Cardiac Enzymes: No results for input(s): CKTOTAL, CKMB, CKMBINDEX, TROPONINI in the last 168 hours.  BNP (last 3 results) No results for input(s): BNP in the last 8760 hours.  ProBNP (last 3 results) No results for input(s): PROBNP in the last 8760  hours.  Radiological Exams: No results found.  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 Acute on chronic respiratory failure with hypoxia we will continue with T collar at baseline patient is not able to Her decannulate because of excessive secretions. 2. Seizure disorder no active seizures are noted at this time. 3. Acute renal failure due to ATN 4. Severe sepsis hemodynamically stable resolved 5. COPD asthma continue present management   I have personally seen and evaluated the patient, evaluated laboratory and imaging results, formulated the assessment and plan and placed orders. The Patient requires high complexity decision making for assessment and support.  Case was discussed on Rounds with the Respiratory Therapy Staff  Yevonne Pax, MD [facility] Pulmonary Critical Care Medicine Sleep Medicine

## 2018-02-16 DIAGNOSIS — Z978 Presence of other specified devices: Secondary | ICD-10-CM | POA: Diagnosis not present

## 2018-02-16 DIAGNOSIS — N170 Acute kidney failure with tubular necrosis: Secondary | ICD-10-CM | POA: Diagnosis not present

## 2018-02-16 DIAGNOSIS — J449 Chronic obstructive pulmonary disease, unspecified: Secondary | ICD-10-CM | POA: Diagnosis not present

## 2018-02-16 DIAGNOSIS — J9621 Acute and chronic respiratory failure with hypoxia: Secondary | ICD-10-CM | POA: Diagnosis not present

## 2018-02-16 NOTE — Progress Notes (Signed)
Pulmonary Critical Care Medicine [facility] [place]   PULMONARY CRITICAL CARE SERVICE  PROGRESS NOTE  Date of Service: 02/16/2018  EZAIAH RITCHIE  ION:[id number]  DOB: 19-Sep-1955   DOA: 01/13/2018  Referring Physician: Carron Curie, MD  HPI: Joseph Patterson is a 63 y.o. male seen for follow up of Acute on Chronic Respiratory Failure.  Remains on T collar at this time patient is on 28% FiO2 good oxygenation secretions remain copious however  Medications: Reviewed on Rounds  Physical Exam:  Vitals: Temperature 97.5 pulse 71 respiratory 20 blood pressure 113/74 saturations 96%  Ventilator Settings off ventilator on T collar  . General: Comfortable at this time . Eyes: Grossly normal lids, irises & conjunctiva . ENT: grossly tongue is normal . Neck: no obvious mass . Cardiovascular: S1 S2 normal no gallop . Respiratory: No rhonchi no rales are noted at this time . Abdomen: soft . Skin: no rash seen on limited exam . Musculoskeletal: not rigid . Psychiatric:unable to assess . Neurologic: no seizure no involuntary movements         Lab Data:   Basic Metabolic Panel: No results for input(s): NA, K, CL, CO2, GLUCOSE, BUN, CREATININE, CALCIUM, MG, PHOS in the last 168 hours.  ABG: No results for input(s): PHART, PCO2ART, PO2ART, HCO3, O2SAT in the last 168 hours.  Liver Function Tests: No results for input(s): AST, ALT, ALKPHOS, BILITOT, PROT, ALBUMIN in the last 168 hours. No results for input(s): LIPASE, AMYLASE in the last 168 hours. No results for input(s): AMMONIA in the last 168 hours.  CBC: Recent Labs  Lab 02/12/18 0539  WBC 7.9  HGB 12.2*  HCT 40.7  MCV 98.8  PLT 221    Cardiac Enzymes: No results for input(s): CKTOTAL, CKMB, CKMBINDEX, TROPONINI in the last 168 hours.  BNP (last 3 results) No results for input(s): BNP in the last 8760 hours.  ProBNP (last 3 results) No results for input(s): PROBNP in the last 8760 hours.  Radiological  Exams: No results found.  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 Acute on chronic respiratory failure with hypoxia we will continue with T collar titrate oxygen continue pulmonary toilet. 2. Severe sepsis hemodynamically stable 3. Acute renal failure due to ATN continue supportive care 4. Seizure disorder no active seizures 5. Chronic asthma stable we will continue to monitor   I have personally seen and evaluated the patient, evaluated laboratory and imaging results, formulated the assessment and plan and placed orders. The Patient requires high complexity decision making for assessment and support.  Case was discussed on Rounds with the Respiratory Therapy Staff  Yevonne Pax, MD [facility] Pulmonary Critical Care Medicine Sleep Medicine

## 2018-02-17 DIAGNOSIS — J449 Chronic obstructive pulmonary disease, unspecified: Secondary | ICD-10-CM | POA: Diagnosis not present

## 2018-02-17 DIAGNOSIS — J9621 Acute and chronic respiratory failure with hypoxia: Secondary | ICD-10-CM | POA: Diagnosis not present

## 2018-02-17 DIAGNOSIS — N170 Acute kidney failure with tubular necrosis: Secondary | ICD-10-CM | POA: Diagnosis not present

## 2018-02-17 DIAGNOSIS — Z978 Presence of other specified devices: Secondary | ICD-10-CM | POA: Diagnosis not present

## 2018-02-17 NOTE — Progress Notes (Addendum)
Pulmonary Critical Care Medicine [facility] [place]   PULMONARY CRITICAL CARE SERVICE  PROGRESS NOTE  Date of Service: 02/17/2018  [name]  [id number]  DOB: 02/11/1955   DOA: 01/13/2018  Referring Physician: Carron Curie, MD  HPI: Joseph Patterson is a 63 y.o. male seen for follow up of Acute on Chronic Respiratory Failure.  Patient continues to do well on trach collar at this time FiO2 28%.  Good oxygenation and secretions are manageable at this time.  Medications: Reviewed on Rounds  Physical Exam:  Vitals: Pulse 76 respirations 21 BP 144/73 O2 sat 97% temp 97.9  Ventilator Settings patient's not currently on ventilator  . General: Comfortable at this time . Eyes: Grossly normal lids, irises & conjunctiva . ENT: grossly tongue is normal . Neck: no obvious mass . Cardiovascular: S1 S2 normal no gallop . Respiratory: Coarse breath sounds noted . Abdomen: soft . Skin: no rash seen on limited exam . Musculoskeletal: not rigid . Psychiatric:unable to assess . Neurologic: no seizure no involuntary movements         Lab Data:   Basic Metabolic Panel: No results for input(s): NA, K, CL, CO2, GLUCOSE, BUN, CREATININE, CALCIUM, MG, PHOS in the last 168 hours.  ABG: No results for input(s): PHART, PCO2ART, PO2ART, HCO3, O2SAT in the last 168 hours.  Liver Function Tests: No results for input(s): AST, ALT, ALKPHOS, BILITOT, PROT, ALBUMIN in the last 168 hours. No results for input(s): LIPASE, AMYLASE in the last 168 hours. No results for input(s): AMMONIA in the last 168 hours.  CBC: Recent Labs  Lab 02/12/18 0539  WBC 7.9  HGB 12.2*  HCT 40.7  MCV 98.8  PLT 221    Cardiac Enzymes: No results for input(s): CKTOTAL, CKMB, CKMBINDEX, TROPONINI in the last 168 hours.  BNP (last 3 results) No results for input(s): BNP in the last 8760 hours.  ProBNP (last 3 results) No results for input(s): PROBNP in the last 8760 hours.  Radiological Exams: No  results found.  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 Acute on chronic respiratory failure with hypoxia continue with trach collar trials and titrate oxygen as tolerated.  Continue pulmonary toilet. 2. Severe sepsis hemodynamic stable 3. Acute renal failure due to ATN continue supportive care 4. Seizure disorder no active seizures 5. Chronic asthma stable continue monitor   I have personally seen and evaluated the patient, evaluated laboratory and imaging results, formulated the assessment and plan and placed orders. The Patient requires high complexity decision making for assessment and support.  Case was discussed on Rounds with the Respiratory Therapy Staff  Yevonne Pax, MD [facility] Pulmonary Critical Care Medicine Sleep Medicine

## 2018-02-18 DIAGNOSIS — N170 Acute kidney failure with tubular necrosis: Secondary | ICD-10-CM | POA: Diagnosis not present

## 2018-02-18 DIAGNOSIS — Z978 Presence of other specified devices: Secondary | ICD-10-CM | POA: Diagnosis not present

## 2018-02-18 DIAGNOSIS — J449 Chronic obstructive pulmonary disease, unspecified: Secondary | ICD-10-CM | POA: Diagnosis not present

## 2018-02-18 DIAGNOSIS — J9621 Acute and chronic respiratory failure with hypoxia: Secondary | ICD-10-CM | POA: Diagnosis not present

## 2018-02-18 NOTE — Progress Notes (Addendum)
Pulmonary Critical Care Medicine [facility] [place]   PULMONARY CRITICAL CARE SERVICE  PROGRESS NOTE  Date of Service: 02/18/2018  Joseph Patterson  [id number]  DOB: 1955/03/29   DOA: 01/13/2018  Referring Physician: Carron Curie, MD  HPI: Joseph Patterson is a 63 y.o. male seen for follow up of Acute on Chronic Respiratory Failure.  Patient doing well on 28% FiO2 via trach collar.  He has excellent saturations and his secretions are manageable at this time  Medications: Reviewed on Rounds  Physical Exam:  Vitals: Pulse 69 respirations 18 BP 132/73 O2 sat 98% temp 97.9  Ventilator Settings patient's not currently on ventilator  . General: Comfortable at this time . Eyes: Grossly normal lids, irises & conjunctiva . ENT: grossly tongue is normal . Neck: no obvious mass . Cardiovascular: S1 S2 normal no gallop . Respiratory: Coarse breath sounds noted . Abdomen: soft . Skin: no rash seen on limited exam . Musculoskeletal: not rigid . Psychiatric:unable to assess . Neurologic: no seizure no involuntary movements         Lab Data:   Basic Metabolic Panel: No results for input(s): NA, K, CL, CO2, GLUCOSE, BUN, CREATININE, CALCIUM, MG, PHOS in the last 168 hours.  ABG: No results for input(s): PHART, PCO2ART, PO2ART, HCO3, O2SAT in the last 168 hours.  Liver Function Tests: No results for input(s): AST, ALT, ALKPHOS, BILITOT, PROT, ALBUMIN in the last 168 hours. No results for input(s): LIPASE, AMYLASE in the last 168 hours. No results for input(s): AMMONIA in the last 168 hours.  CBC: Recent Labs  Lab 02/12/18 0539  WBC 7.9  HGB 12.2*  HCT 40.7  MCV 98.8  PLT 221    Cardiac Enzymes: No results for input(s): CKTOTAL, CKMB, CKMBINDEX, TROPONINI in the last 168 hours.  BNP (last 3 results) No results for input(s): BNP in the last 8760 hours.  ProBNP (last 3 results) No results for input(s): PROBNP in the last 8760 hours.  Radiological Exams: No  results found.  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 Acute on chronic respiratory failure with hypoxia continue with T collar trials and titrate oxygen as tolerated per protocol.  Continue aggressive pulmonary toilet 2. Severe sepsis hemodynamically stable 3. Acute renal failure due to ATN continue supportive care 4. Seizure disorder no active seizures 5. Chronic asthma stable continue to monitor   I have personally seen and evaluated the patient, evaluated laboratory and imaging results, formulated the assessment and plan and placed orders. The Patient requires high complexity decision making for assessment and support.  Case was discussed on Rounds with the Respiratory Therapy Staff  Yevonne Pax, MD [facility] Pulmonary Critical Care Medicine Sleep Medicine

## 2018-02-19 DIAGNOSIS — N170 Acute kidney failure with tubular necrosis: Secondary | ICD-10-CM | POA: Diagnosis not present

## 2018-02-19 DIAGNOSIS — Z978 Presence of other specified devices: Secondary | ICD-10-CM | POA: Diagnosis not present

## 2018-02-19 DIAGNOSIS — J9621 Acute and chronic respiratory failure with hypoxia: Secondary | ICD-10-CM | POA: Diagnosis not present

## 2018-02-19 DIAGNOSIS — J449 Chronic obstructive pulmonary disease, unspecified: Secondary | ICD-10-CM | POA: Diagnosis not present

## 2018-02-19 LAB — BASIC METABOLIC PANEL
Anion gap: 11 (ref 5–15)
BUN: 27 mg/dL — AB (ref 8–23)
CO2: 29 mmol/L (ref 22–32)
Calcium: 8.8 mg/dL — ABNORMAL LOW (ref 8.9–10.3)
Chloride: 99 mmol/L (ref 98–111)
Creatinine, Ser: 0.95 mg/dL (ref 0.61–1.24)
GFR calc Af Amer: >60 mL/min (ref >60)
GFR calc non Af Amer: >60 mL/min (ref >60)
Glucose, Bld: 101 mg/dL — ABNORMAL HIGH (ref 70–99)
Potassium: 4.2 mmol/L (ref 3.5–5.1)
Sodium: 139 mmol/L (ref 135–145)

## 2018-02-19 LAB — CBC
HCT: 38.3 % — ABNORMAL LOW (ref 39.0–52.0)
Hemoglobin: 11.7 g/dL — ABNORMAL LOW (ref 13.0–17.0)
MCH: 30 pg (ref 26.0–34.0)
MCHC: 30.5 g/dL (ref 30.0–36.0)
MCV: 98.2 fL (ref 80.0–100.0)
Platelets: 142 10*3/uL — ABNORMAL LOW (ref 150–400)
RBC: 3.9 MIL/uL — ABNORMAL LOW (ref 4.22–5.81)
RDW: 16.3 % — AB (ref 11.5–15.5)
WBC: 4.2 10*3/uL (ref 4.0–10.5)
nRBC: 0 % (ref 0.0–0.2)

## 2018-02-19 NOTE — Progress Notes (Signed)
[facility] [place]   PULMONARY CRITICAL CARE SERVICE  PROGRESS NOTE  Date of Service: 02/19/2018  Joseph Patterson  [id number]  DOB: September 30, 1955   DOA: 01/13/2018  Referring Physician: Carron CurieAli Hijazi, MD  HPI: Joseph Patterson is a 63 y.o. male seen for follow up of Acute on Chronic Respiratory Failure.  Remains on T collar at this time on 28% FiO2 has a cuffless trach which was changed and is tolerating it well  Medications: Reviewed on Rounds  Physical Exam:  Vitals: Temperature 96.7 pulse 82 respiratory 20 blood pressure 98/66 saturations 96%  Ventilator Settings on T collar FiO2 is 28%  . General: Comfortable at this time . Eyes: Grossly normal lids, irises & conjunctiva . ENT: grossly tongue is normal . Neck: no obvious mass . Cardiovascular: S1 S2 normal no gallop . Respiratory: No rhonchi or rales are noted at this time . Abdomen: soft . Skin: no rash seen on limited exam . Musculoskeletal: not rigid . Psychiatric:unable to assess . Neurologic: no seizure no involuntary movements         Lab Data:   Basic Metabolic Panel: Recent Labs  Lab 02/19/18 0534  NA 139  K 4.2  CL 99  CO2 29  GLUCOSE 101*  BUN 27*  CREATININE 0.95  CALCIUM 8.8*    ABG: No results for input(s): PHART, PCO2ART, PO2ART, HCO3, O2SAT in the last 168 hours.  Liver Function Tests: No results for input(s): AST, ALT, ALKPHOS, BILITOT, PROT, ALBUMIN in the last 168 hours. No results for input(s): LIPASE, AMYLASE in the last 168 hours. No results for input(s): AMMONIA in the last 168 hours.  CBC: Recent Labs  Lab 02/19/18 0534  WBC 4.2  HGB 11.7*  HCT 38.3*  MCV 98.2  PLT 142*    Cardiac Enzymes: No results for input(s): CKTOTAL, CKMB, CKMBINDEX, TROPONINI in the last 168 hours.  BNP (last 3 results) No results for input(s): BNP in the last 8760 hours.  ProBNP (last 3 results) No results for input(s): PROBNP in the last 8760  hours.  Radiological Exams: No results found.  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 Acute on chronic respiratory failure with hypoxia we will continue T collar titrate oxygen continue aggressive pulmonary toilet 2. Severe sepsis resolved 3. Acute renal failure labs improved 4. Seizure disorder no active seizures are noted at this time 5. Chronic asthma stable no decompensation noted   I have personally seen and evaluated the patient, evaluated laboratory and imaging results, formulated the assessment and plan and placed orders. The Patient requires high complexity decision making for assessment and support.  Case was discussed on Rounds with the Respiratory Therapy Staff  Yevonne PaxSaadat A Khan, MD [facility] Pulmonary Critical Care Medicine Sleep Medicine

## 2018-02-20 DIAGNOSIS — J9621 Acute and chronic respiratory failure with hypoxia: Secondary | ICD-10-CM | POA: Diagnosis not present

## 2018-02-20 DIAGNOSIS — Z978 Presence of other specified devices: Secondary | ICD-10-CM | POA: Diagnosis not present

## 2018-02-20 DIAGNOSIS — N170 Acute kidney failure with tubular necrosis: Secondary | ICD-10-CM | POA: Diagnosis not present

## 2018-02-20 DIAGNOSIS — J449 Chronic obstructive pulmonary disease, unspecified: Secondary | ICD-10-CM | POA: Diagnosis not present

## 2018-02-20 NOTE — Progress Notes (Signed)
Pulmonary Critical Care Medicine [facility] [place]   PULMONARY CRITICAL CARE SERVICE  PROGRESS NOTE  Date of Service: 02/20/2018  Joseph Patterson  [id number]:[id number]  DOB: [birth year]   DOA: 01/13/2018  Referring Physician: Carron Curie, MD  HPI: Joseph Patterson is a 63 y.o. male seen for follow up of Acute on Chronic Respiratory Failure.  Patient is on T collar oxygen was decreased down to room air seems to be doing well but secretions are still copious  Medications: Reviewed on Rounds  Physical Exam:  Vitals: Temperature 97.7 pulse 74 respiratory 22 blood pressure 110/67 saturations 97%  Ventilator Settings off the ventilator on T collar right now  . General: Comfortable at this time . Eyes: Grossly normal lids, irises & conjunctiva . ENT: grossly tongue is normal . Neck: no obvious mass . Cardiovascular: S1 S2 normal no gallop . Respiratory: Coarse breath sounds with a few rhonchi . Abdomen: soft . Skin: no rash seen on limited exam . Musculoskeletal: not rigid . Psychiatric:unable to assess . Neurologic: no seizure no involuntary movements         Lab Data:   Basic Metabolic Panel: Recent Labs  Lab 02/19/18 0534  NA 139  K 4.2  CL 99  CO2 29  GLUCOSE 101*  BUN 27*  CREATININE 0.95  CALCIUM 8.8*    ABG: No results for input(s): PHART, PCO2ART, PO2ART, HCO3, O2SAT in the last 168 hours.  Liver Function Tests: No results for input(s): AST, ALT, ALKPHOS, BILITOT, PROT, ALBUMIN in the last 168 hours. No results for input(s): LIPASE, AMYLASE in the last 168 hours. No results for input(s): AMMONIA in the last 168 hours.  CBC: Recent Labs  Lab 02/19/18 0534  WBC 4.2  HGB 11.7*  HCT 38.3*  MCV 98.2  PLT 142*    Cardiac Enzymes: No results for input(s): CKTOTAL, CKMB, CKMBINDEX, TROPONINI in the last 168 hours.  BNP (last 3 results) No results for input(s): BNP in the last 8760 hours.  ProBNP (last 3 results) No results for input(s): PROBNP in  the last 8760 hours.  Radiological Exams: No results found.  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 Acute on chronic respiratory failure with hypoxia we will continue with T collar titrate oxygen as necessary continue aggressive pulmonary toilet 2. Severe sepsis hemodynamically stable 3. Acute renal failure due to ATN treated we will continue to monitor 4. Seizure disorder no active seizures noted 5. Neck asthma is controlled we will continue with supportive care   I have personally seen and evaluated the patient, evaluated laboratory and imaging results, formulated the assessment and plan and placed orders. The Patient requires high complexity decision making for assessment and support.  Case was discussed on Rounds with the Respiratory Therapy Staff  Yevonne Pax, MD [facility] Pulmonary Critical Care Medicine Sleep Medicine

## 2018-02-21 DIAGNOSIS — J449 Chronic obstructive pulmonary disease, unspecified: Secondary | ICD-10-CM | POA: Diagnosis not present

## 2018-02-21 DIAGNOSIS — Z978 Presence of other specified devices: Secondary | ICD-10-CM | POA: Diagnosis not present

## 2018-02-21 DIAGNOSIS — N170 Acute kidney failure with tubular necrosis: Secondary | ICD-10-CM | POA: Diagnosis not present

## 2018-02-21 DIAGNOSIS — J9621 Acute and chronic respiratory failure with hypoxia: Secondary | ICD-10-CM | POA: Diagnosis not present

## 2018-02-21 NOTE — Progress Notes (Addendum)
Pulmonary Critical Care Medicine [facility] [place]   PULMONARY CRITICAL CARE SERVICE  PROGRESS NOTE  Date of Service: 02/21/2018  Joseph Patterson  ZMC:[id number]  DOB: [birth year]   DOA: 01/13/2018  Referring Physician: Carron Curie, MD  HPI: Joseph Patterson is a 63 y.o. male seen for follow up of Acute on Chronic Respiratory Failure.  Patient is doing well on 21% FiO2 trach collar.  He continues to do well however he does have moderate amount of secretions at this time.  He is using a PMV with no difficulty.  Medications: Reviewed on Rounds  Physical Exam:  Vitals: Pulse 84 respirations 22 BP 90/55 O2 sat 92% temp 97.4  Ventilator Settings patient's not currently on ventilator  . General: Comfortable at this time . Eyes: Grossly normal lids, irises & conjunctiva . ENT: grossly tongue is normal . Neck: no obvious mass . Cardiovascular: S1 S2 normal no gallop . Respiratory: Coarse breath sounds . Abdomen: soft . Skin: no rash seen on limited exam . Musculoskeletal: not rigid . Psychiatric:unable to assess . Neurologic: no seizure no involuntary movements         Lab Data:   Basic Metabolic Panel: Recent Labs  Lab 02/19/18 0534  NA 139  K 4.2  CL 99  CO2 29  GLUCOSE 101*  BUN 27*  CREATININE 0.95  CALCIUM 8.8*    ABG: No results for input(s): PHART, PCO2ART, PO2ART, HCO3, O2SAT in the last 168 hours.  Liver Function Tests: No results for input(s): AST, ALT, ALKPHOS, BILITOT, PROT, ALBUMIN in the last 168 hours. No results for input(s): LIPASE, AMYLASE in the last 168 hours. No results for input(s): AMMONIA in the last 168 hours.  CBC: Recent Labs  Lab 02/19/18 0534  WBC 4.2  HGB 11.7*  HCT 38.3*  MCV 98.2  PLT 142*    Cardiac Enzymes: No results for input(s): CKTOTAL, CKMB, CKMBINDEX, TROPONINI in the last 168 hours.  BNP (last 3 results) No results for input(s): BNP in the last 8760 hours.  ProBNP (last 3 results) No results for  input(s): PROBNP in the last 8760 hours.  Radiological Exams: No results found.  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 Acute on chronic respiratory with hypoxia continue with trach collar on room air settings.  Continue aggressive [place] toilet and secretion management 2. Severe sepsis hemodynamically stable 3. Acute renal failure due to ATN treated we will continue to monitor 4. Seizure disorder no active seizures noted 5. Chronic asthma stable   I have personally seen and evaluated the patient, evaluated laboratory and imaging results, formulated the assessment and plan and placed orders. The Patient requires high complexity decision making for assessment and support.  Case was discussed on Rounds with the Respiratory Therapy Staff  Yevonne Pax, MD [facility] Pulmonary Critical Care Medicine Sleep Medicine

## 2018-02-22 DIAGNOSIS — N170 Acute kidney failure with tubular necrosis: Secondary | ICD-10-CM | POA: Diagnosis not present

## 2018-02-22 DIAGNOSIS — J9621 Acute and chronic respiratory failure with hypoxia: Secondary | ICD-10-CM | POA: Diagnosis not present

## 2018-02-22 DIAGNOSIS — J449 Chronic obstructive pulmonary disease, unspecified: Secondary | ICD-10-CM | POA: Diagnosis not present

## 2018-02-22 DIAGNOSIS — Z978 Presence of other specified devices: Secondary | ICD-10-CM | POA: Diagnosis not present

## 2018-02-22 NOTE — Progress Notes (Addendum)
Pulmonary Critical Care Medicine [facility] [place]   PULMONARY CRITICAL CARE SERVICE  PROGRESS NOTE  Date of Service: 02/22/2018  MJ WALT  [id number]  DOB: 1955/08/01   DOA: 01/13/2018  Referring Physician: Carron Curie, MD  HPI: Joseph Patterson is a 62 y.o. male seen for follow up of Acute on Chronic Respiratory Failure.  Patient continues to do well on 20% FiO2 via trach collar.  He is using his PMV and requiring minimal suction for his secretions.  Medications: Reviewed on Rounds  Physical Exam:  Vitals: Pulse 77 respirations 20 BP 110/77 O2 sat 94% temp 97.2  Ventilator Settings patient's not currently on ventilator  . General: Comfortable at this time . Eyes: Grossly normal lids, irises & conjunctiva . ENT: grossly tongue is normal . Neck: no obvious mass . Cardiovascular: S1 S2 normal no gallop . Respiratory: Coarse breath sounds . Abdomen: soft . Skin: no rash seen on limited exam . Musculoskeletal: not rigid . Psychiatric:unable to assess . Neurologic: no seizure no involuntary movements         Lab Data:   Basic Metabolic Panel: Recent Labs  Lab 02/19/18 0534  NA 139  K 4.2  CL 99  CO2 29  GLUCOSE 101*  BUN 27*  CREATININE 0.95  CALCIUM 8.8*    ABG: No results for input(s): PHART, PCO2ART, PO2ART, HCO3, O2SAT in the last 168 hours.  Liver Function Tests: No results for input(s): AST, ALT, ALKPHOS, BILITOT, PROT, ALBUMIN in the last 168 hours. No results for input(s): LIPASE, AMYLASE in the last 168 hours. No results for input(s): AMMONIA in the last 168 hours.  CBC: Recent Labs  Lab 02/19/18 0534  WBC 4.2  HGB 11.7*  HCT 38.3*  MCV 98.2  PLT 142*    Cardiac Enzymes: No results for input(s): CKTOTAL, CKMB, CKMBINDEX, TROPONINI in the last 168 hours.  BNP (last 3 results) No results for input(s): BNP in the last 8760 hours.  ProBNP (last 3 results) No results for input(s): PROBNP in the last 8760  hours.  Radiological Exams: No results found.  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 Acute on chronic respiratory failure with hypoxia continue with trach collar on room air settings.  Continue aggressive pulmonary toilet and secretion management. 2. Severe sepsis hemodynamically stable 3. Acute renal failure due to ATN treated continue to monitor 4. Seizure disorder no active seizures noted 5. Chronic asthma stable   I have personally seen and evaluated the patient, evaluated laboratory and imaging results, formulated the assessment and plan and placed orders. The Patient requires high complexity decision making for assessment and support.  Case was discussed on Rounds with the Respiratory Therapy Staff  Yevonne Pax, MD [facility] Pulmonary Critical Care Medicine Sleep Medicine

## 2018-02-23 DIAGNOSIS — Z978 Presence of other specified devices: Secondary | ICD-10-CM | POA: Diagnosis not present

## 2018-02-23 DIAGNOSIS — J9621 Acute and chronic respiratory failure with hypoxia: Secondary | ICD-10-CM | POA: Diagnosis not present

## 2018-02-23 DIAGNOSIS — J449 Chronic obstructive pulmonary disease, unspecified: Secondary | ICD-10-CM | POA: Diagnosis not present

## 2018-02-23 DIAGNOSIS — N170 Acute kidney failure with tubular necrosis: Secondary | ICD-10-CM | POA: Diagnosis not present

## 2018-02-23 NOTE — Progress Notes (Addendum)
Pulmonary Critical Care Medicine [facility] [place]   PULMONARY CRITICAL CARE SERVICE  PROGRESS NOTE  Date of Service: 02/23/2018  KATIE SCHOWALTER  ZOX:[id number]  DOB: Jun 27, 1955   DOA: 01/13/2018  Referring Physician: Carron Curie, MD  HPI: Joseph Patterson is a 63 y.o. male seen for follow up of Acute on Chronic Respiratory Failure.  Patient doing well on trach collar 21% FiO2.  He continues to [facility] and requires minimal suction.  Medications: Reviewed on Rounds  Physical Exam:  Vitals: Pulse 79 respirations 20 BP 134/73 O2 sat 96% temp 98.0  Ventilator Settings patient not currently on ventilator  . General: Comfortable at this time . Eyes: Grossly normal lids, irises & conjunctiva . ENT: grossly tongue is normal . Neck: no obvious mass . Cardiovascular: S1 S2 normal no gallop . Respiratory: No wheezes or rhonchi noted . Abdomen: soft . Skin: no rash seen on limited exam . Musculoskeletal: not rigid . Psychiatric:unable to assess . Neurologic: no seizure no involuntary movements         Lab Data:   Basic Metabolic Panel: Recent Labs  Lab 02/19/18 0534  NA 139  K 4.2  CL 99  CO2 29  GLUCOSE 101*  BUN 27*  CREATININE 0.95  CALCIUM 8.8*    ABG: No results for input(s): PHART, PCO2ART, PO2ART, HCO3, O2SAT in the last 168 hours.  Liver Function Tests: No results for input(s): AST, ALT, ALKPHOS, BILITOT, PROT, ALBUMIN in the last 168 hours. No results for input(s): LIPASE, AMYLASE in the last 168 hours. No results for input(s): AMMONIA in the last 168 hours.  CBC: Recent Labs  Lab 02/19/18 0534  WBC 4.2  HGB 11.7*  HCT 38.3*  MCV 98.2  PLT 142*    Cardiac Enzymes: No results for input(s): CKTOTAL, CKMB, CKMBINDEX, TROPONINI in the last 168 hours.  BNP (last 3 results) No results for input(s): BNP in the last 8760 hours.  ProBNP (last 3 results) No results for input(s): PROBNP in the last 8760 hours.  Radiological Exams: No results  found.  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 Acute on chronic respiratory failure with hypoxia continue trach collar on room air.  Continue aggressive pulmonary toilet and secretion management.  Continue supportive measures. 2. Severe sepsis hemodynamically stable 3. Acute renal failure due to ATN treated continue to monitor 4. Seizure disorder no active seizures noted 5. Chronic asthma stable   I have personally seen and evaluated the patient, evaluated laboratory and imaging results, formulated the assessment and plan and placed orders. The Patient requires high complexity decision making for assessment and support.  Case was discussed on Rounds with the Respiratory Therapy Staff  Yevonne Pax, MD [facility] Pulmonary Critical Care Medicine Sleep Medicine

## 2018-02-24 DIAGNOSIS — J449 Chronic obstructive pulmonary disease, unspecified: Secondary | ICD-10-CM | POA: Diagnosis not present

## 2018-02-24 DIAGNOSIS — Z978 Presence of other specified devices: Secondary | ICD-10-CM | POA: Diagnosis not present

## 2018-02-24 DIAGNOSIS — J9621 Acute and chronic respiratory failure with hypoxia: Secondary | ICD-10-CM | POA: Diagnosis not present

## 2018-02-24 DIAGNOSIS — N170 Acute kidney failure with tubular necrosis: Secondary | ICD-10-CM | POA: Diagnosis not present

## 2018-02-24 NOTE — Progress Notes (Addendum)
Pulmonary Critical Care Medicine [facility] [place]   PULMONARY CRITICAL CARE SERVICE  PROGRESS NOTE  Date of Service: 02/24/2018  Joseph Patterson  [id number]:[id number]  DOB: 04-09-1955   DOA: 01/13/2018  Referring Physician: Carron Curie, MD  HPI: Joseph Patterson is a 63 y.o. male seen for follow up of Acute on Chronic Respiratory Failure. Patient doing well on trach collar 21% FiO2.  Continues to use PMV without any difficulty.  No acute distress noted at this time.  Continues to require minimum suction  Medications: Reviewed on Rounds  Physical Exam:  Vitals: Pulse 82 respirations 22 BP 113/71 O2 sat 95% temp 97.7  Ventilator Settings patient not currently on ventilator  . General: Comfortable at this time . Eyes: Grossly normal lids, irises & conjunctiva . ENT: grossly tongue is normal . Neck: no obvious mass . Cardiovascular: S1 S2 normal no gallop . Respiratory: No rales or rhonchi noted . Abdomen: soft . Skin: no rash seen on limited exam . Musculoskeletal: not rigid . Psychiatric:unable to assess . Neurologic: no seizure no involuntary movements         Lab Data:   Basic Metabolic Panel: Recent Labs  Lab 02/19/18 0534  NA 139  K 4.2  CL 99  CO2 29  GLUCOSE 101*  BUN 27*  CREATININE 0.95  CALCIUM 8.8*    ABG: No results for input(s): PHART, PCO2ART, PO2ART, HCO3, O2SAT in the last 168 hours.  Liver Function Tests: No results for input(s): AST, ALT, ALKPHOS, BILITOT, PROT, ALBUMIN in the last 168 hours. No results for input(s): LIPASE, AMYLASE in the last 168 hours. No results for input(s): AMMONIA in the last 168 hours.  CBC: Recent Labs  Lab 02/19/18 0534  WBC 4.2  HGB 11.7*  HCT 38.3*  MCV 98.2  PLT 142*    Cardiac Enzymes: No results for input(s): CKTOTAL, CKMB, CKMBINDEX, TROPONINI in the last 168 hours.  BNP (last 3 results) No results for input(s): BNP in the last 8760 hours.  ProBNP (last 3 results) No results for input(s):  PROBNP in the last 8760 hours.  Radiological Exams: No results found.  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 Acute on chronic respiratory failure with hypoxia continue with trach collar on room air at this time.  Continue aggressive pulmonary toilet and secretion management. 2. Severe sepsis hemodynamically stable 3. Acute renal failure due to ATN continue to monitor 4. Seizure disorder no active seizures 5. Chronic asthma stable   I have personally seen and evaluated the patient, evaluated laboratory and imaging results, formulated the assessment and plan and placed orders. The Patient requires high complexity decision making for assessment and support.  Case was discussed on Rounds with the Respiratory Therapy Staff  Yevonne Pax, MD [facility] Pulmonary Critical Care Medicine Sleep Medicine

## 2018-02-25 DIAGNOSIS — J449 Chronic obstructive pulmonary disease, unspecified: Secondary | ICD-10-CM | POA: Diagnosis not present

## 2018-02-25 DIAGNOSIS — J9621 Acute and chronic respiratory failure with hypoxia: Secondary | ICD-10-CM | POA: Diagnosis not present

## 2018-02-25 DIAGNOSIS — N170 Acute kidney failure with tubular necrosis: Secondary | ICD-10-CM | POA: Diagnosis not present

## 2018-02-25 DIAGNOSIS — Z978 Presence of other specified devices: Secondary | ICD-10-CM | POA: Diagnosis not present

## 2018-02-25 NOTE — Progress Notes (Addendum)
Pulmonary Critical Care Medicine [facility] [place]   PULMONARY CRITICAL CARE SERVICE  PROGRESS NOTE  Date of Service: 02/25/2018  Joseph Patterson  [id number]  DOB: 1955-05-30   DOA: 01/13/2018  Referring Physician: Carron Curie, MD  HPI: Joseph Patterson is a 63 y.o. male seen for follow up of Acute on Chronic Respiratory Failure.  Patient is doing very well on trach collar 21% FiO2.  Using PMV with no difficulty.  Minimal secretions noted  Medications: Reviewed on Rounds  Physical Exam:  Vitals: Pulse 89 respirations 19 BP 121/67 O2 sat 91% temp 97.3  Ventilator Settings patient's not currently on ventilator  . General: Comfortable at this time . Eyes: Grossly normal lids, irises & conjunctiva . ENT: grossly tongue is normal . Neck: no obvious mass . Cardiovascular: S1 S2 normal no gallop . Respiratory: No rales or rhonchi noted . Abdomen: soft . Skin: no rash seen on limited exam . Musculoskeletal: not rigid . Psychiatric:unable to assess . Neurologic: no seizure no involuntary movements         Lab Data:   Basic Metabolic Panel: Recent Labs  Lab 02/19/18 0534  NA 139  K 4.2  CL 99  CO2 29  GLUCOSE 101*  BUN 27*  CREATININE 0.95  CALCIUM 8.8*    ABG: No results for input(s): PHART, PCO2ART, PO2ART, HCO3, O2SAT in the last 168 hours.  Liver Function Tests: No results for input(s): AST, ALT, ALKPHOS, BILITOT, PROT, ALBUMIN in the last 168 hours. No results for input(s): LIPASE, AMYLASE in the last 168 hours. No results for input(s): AMMONIA in the last 168 hours.  CBC: Recent Labs  Lab 02/19/18 0534  WBC 4.2  HGB 11.7*  HCT 38.3*  MCV 98.2  PLT 142*    Cardiac Enzymes: No results for input(s): CKTOTAL, CKMB, CKMBINDEX, TROPONINI in the last 168 hours.  BNP (last 3 results) No results for input(s): BNP in the last 8760 hours.  ProBNP (last 3 results) No results for input(s): PROBNP in the last 8760 hours.  Radiological Exams: No  results found.  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 Acute on chronic respiratory failure with hypoxia continue with trach collar on room air at this time.  Continue aggressive pulmonary toilet and secretion management. 2. Severe sepsis hemodynamically stable 3. Acute renal failure due to ATN continue to monitor 4. Seizure disorder no active seizures 5. Chronic asthma stable   I have personally seen and evaluated the patient, evaluated laboratory and imaging results, formulated the assessment and plan and placed orders. The Patient requires high complexity decision making for assessment and support.  Case was discussed on Rounds with the Respiratory Therapy Staff  Yevonne Pax, MD [facility] Pulmonary Critical Care Medicine Sleep Medicine

## 2018-02-26 DIAGNOSIS — A419 Sepsis, unspecified organism: Secondary | ICD-10-CM

## 2018-02-26 DIAGNOSIS — J9621 Acute and chronic respiratory failure with hypoxia: Secondary | ICD-10-CM | POA: Diagnosis not present

## 2018-02-26 DIAGNOSIS — R6520 Severe sepsis without septic shock: Secondary | ICD-10-CM

## 2018-02-26 DIAGNOSIS — N170 Acute kidney failure with tubular necrosis: Secondary | ICD-10-CM

## 2018-02-26 DIAGNOSIS — G40909 Epilepsy, unspecified, not intractable, without status epilepticus: Secondary | ICD-10-CM

## 2018-02-26 DIAGNOSIS — J449 Chronic obstructive pulmonary disease, unspecified: Secondary | ICD-10-CM

## 2018-02-26 LAB — URINE CULTURE: Culture: >=100000 — AB

## 2018-02-26 NOTE — Progress Notes (Signed)
Pulmonary Critical Care Medicine [facility] [place]   PULMONARY CRITICAL CARE SERVICE  PROGRESS NOTE  Date of Service: 02/26/2018  Joseph Patterson  VAN:[id number]  DOB: [birth year]   DOA: (Not on file)  Referring Physician: Carron Curie, MD  HPI: Joseph Patterson is a 63 y.o. male seen for follow up of Acute on Chronic Respiratory Failure.  Resting comfortably right now without distress patient is on T collar with PMV supposed to be discharged today  Medications: Reviewed on Rounds  Physical Exam:  Vitals: Temperature 97.8 pulse 88 respiratory rate 20 blood pressure 121/64 saturations 93%  Ventilator Settings off the ventilator on T collar  . General: Comfortable at this time . Eyes: Grossly normal lids, irises & conjunctiva . ENT: grossly tongue is normal . Neck: no obvious mass . Cardiovascular: S1 S2 normal no gallop . Respiratory: No rhonchi or rales are noted . Abdomen: soft . Skin: no rash seen on limited exam . Musculoskeletal: not rigid . Psychiatric:unable to assess . Neurologic: no seizure no involuntary movements         Lab Data:   Basic Metabolic Panel: No results for input(s): NA, K, CL, CO2, GLUCOSE, BUN, CREATININE, CALCIUM, MG, PHOS in the last 168 hours.  ABG: No results for input(s): PHART, PCO2ART, PO2ART, HCO3, O2SAT in the last 168 hours.  Liver Function Tests: No results for input(s): AST, ALT, ALKPHOS, BILITOT, PROT, ALBUMIN in the last 168 hours. No results for input(s): LIPASE, AMYLASE in the last 168 hours. No results for input(s): AMMONIA in the last 168 hours.  CBC: No results for input(s): WBC, NEUTROABS, HGB, HCT, MCV, PLT in the last 168 hours.  Cardiac Enzymes: No results for input(s): CKTOTAL, CKMB, CKMBINDEX, TROPONINI in the last 168 hours.  BNP (last 3 results) No results for input(s): BNP in the last 8760 hours.  ProBNP (last 3 results) No results for input(s): PROBNP in the last 8760 hours.  Radiological  Exams: No results found.  Assessment/Plan Active Problems:   * No active hospital problems. *   1. Acute on chronic respiratory failure with hypoxia at this time patient is at baseline on T collar will continue with supportive care oxygen as necessary continue PMV 2. Severe sepsis resolved 3. Acute renal failure resolved 4. Seizure disorder no active seizures are noted 5. Chronic asthma stable we will continue with supportive care   I have personally seen and evaluated the patient, evaluated laboratory and imaging results, formulated the assessment and plan and placed orders. The Patient requires high complexity decision making for assessment and support.  Case was discussed on Rounds with the Respiratory Therapy Staff  Yevonne Pax, MD [facility] Pulmonary Critical Care Medicine Sleep Medicine

## 2020-03-10 DEATH — deceased

## 2020-05-06 IMAGING — DX DG CHEST 1V PORT
1 series · 1 of 1 positions shown · non-contrast
Comparison: None.

CLINICAL DATA: Endotracheal tube placement.

EXAM:
PORTABLE CHEST 1 VIEW

[chest]
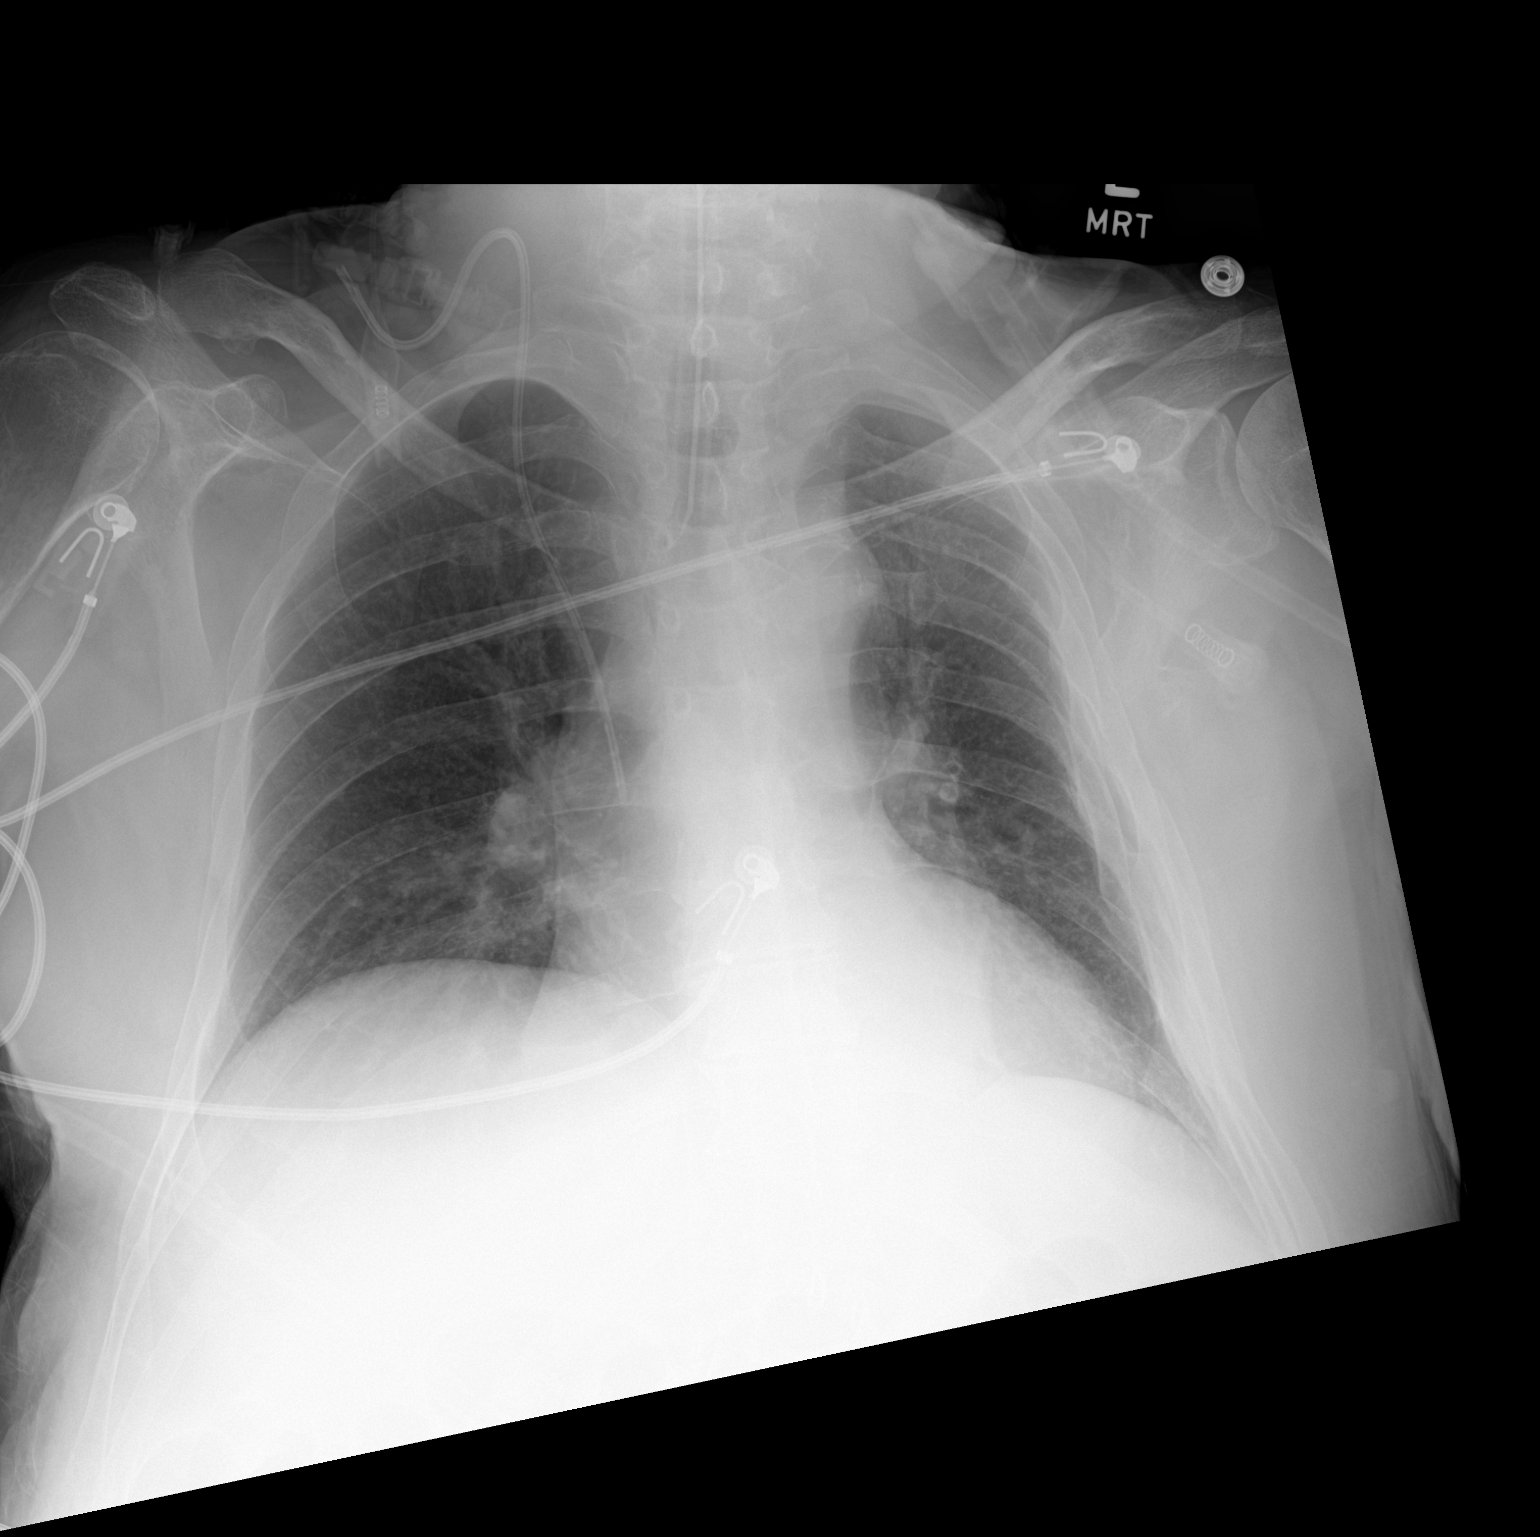

[1 of 1 positions shown; findings below may reference images not displayed]

FINDINGS: Endotracheal tube tip projects 3.8 cm above the Carina.

Right internal jugular central venous line, tip in the mid to lower
superior vena cava.

Cardiac silhouette is normal in size. No mediastinal or hilar
masses.

Mild opacity at the medial left lung base, most likely atelectasis.
Lungs otherwise clear. No convincing pleural effusion.

No pneumothorax.

Multiple old left-sided rib fractures.
IMPRESSION: 1. Endotracheal tube tip projects 3.8 cm above the Carina. Right
internal jugular central venous line tip projects at the mid to
lower superior vena cava. No pneumothorax.
2. Mild opacity at the medial left lung base, most likely
atelectasis. No convincing acute cardiopulmonary disease.

## 2020-05-11 IMAGING — DX DG CHEST 1V PORT
1 series · 1 of 1 positions shown · non-contrast
Comparison: 01/13/2018

CLINICAL DATA: Endotracheal tube placement

EXAM:
PORTABLE CHEST 1 VIEW

[chest ap]
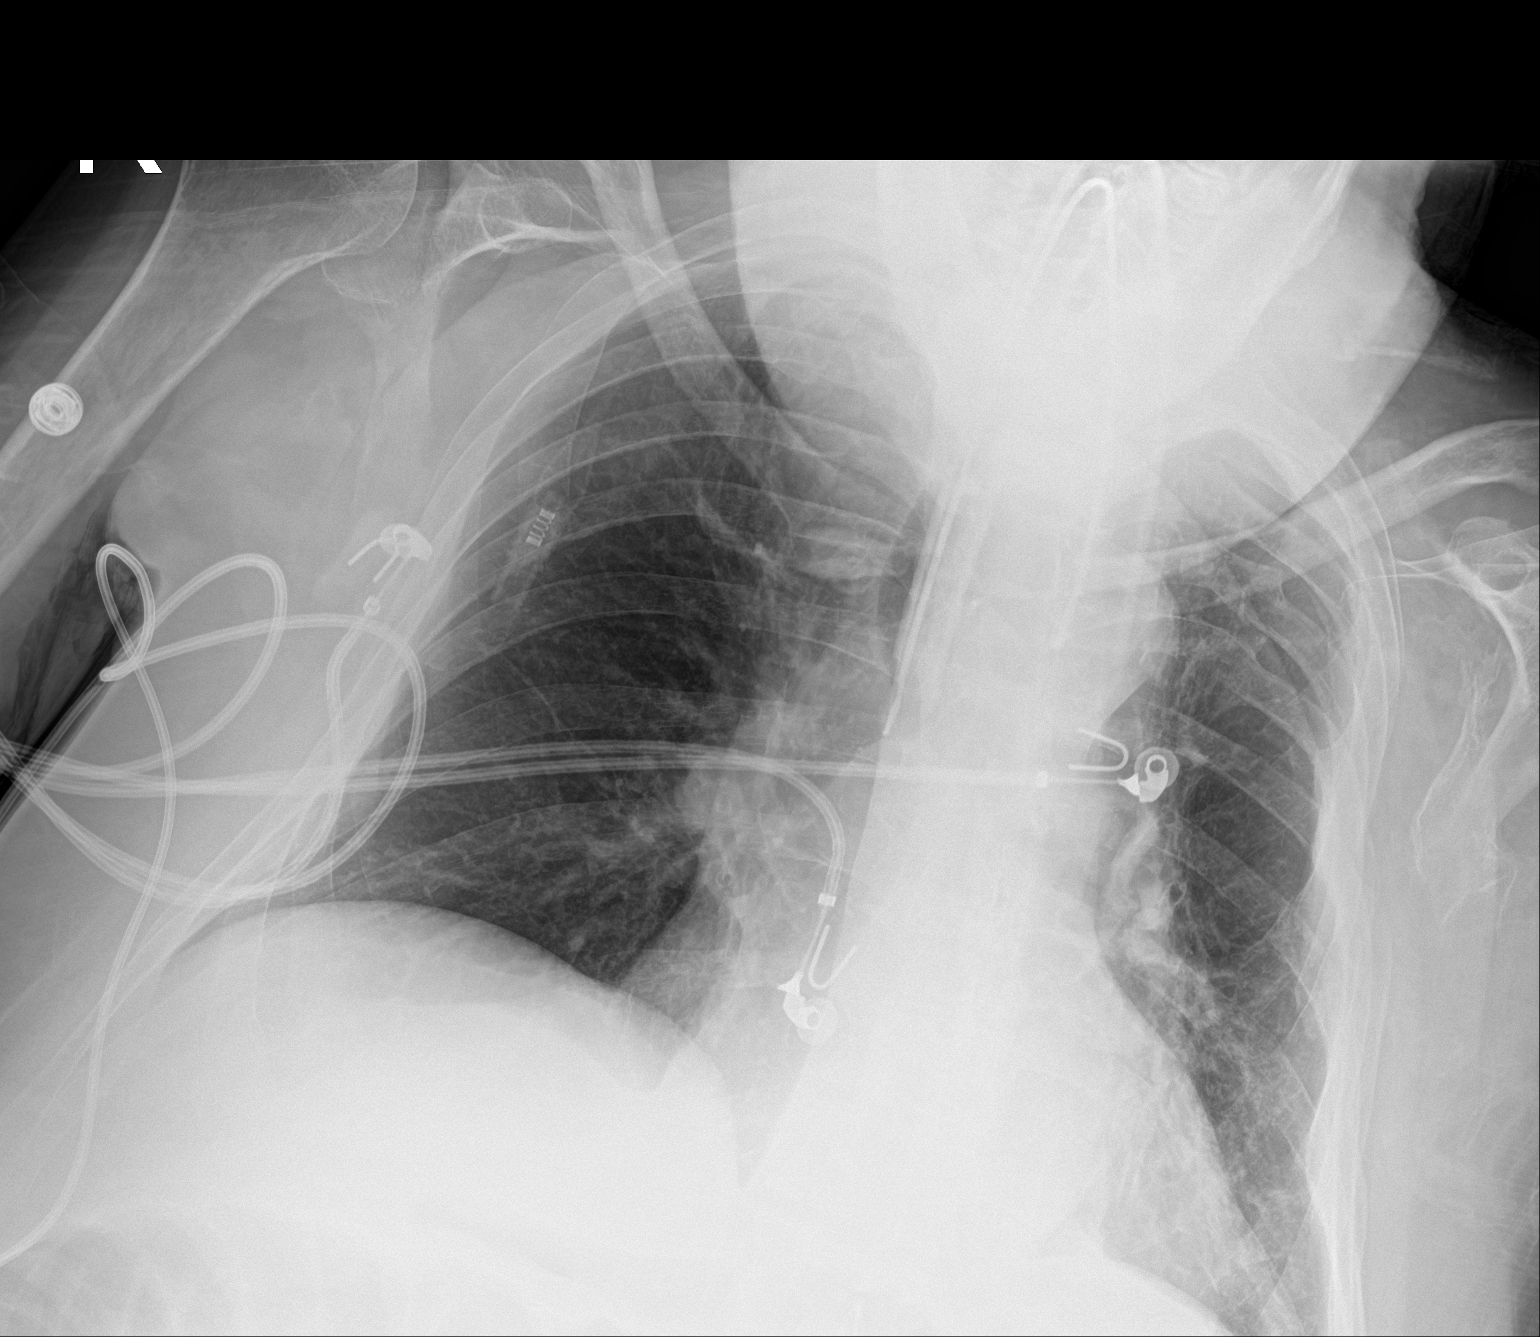

[1 of 1 positions shown; findings below may reference images not displayed]

FINDINGS: Shallow inspiration and patient positioning limits the evaluation of
the tube placement. An endotracheal tube is present. Tip appears to
be located at the level of the carina, but this could just be due to
patient positioning. Recommend repeat study with better inspiration
for more accurate evaluation of tube placement level. Heart size and
pulmonary vascularity are normal. Scattered fibrosis in the lungs.
No focal consolidation. Probable emphysematous changes. No blunting
of visualized costophrenic angles. No pneumothorax. Calcified and
tortuous aorta. Probable old fracture deformities of the left ribs
and left scapula.
IMPRESSION: Endotracheal tube tip appears to be at the level of the carina, but
this could just be due to patient positioning. Recommend repeat
study with better inspiration for more accurate evaluation.

## 2020-05-11 IMAGING — DX DG CHEST 1V PORT
1 series · 1 of 1 positions shown · non-contrast
Comparison: 01/18/2018

CLINICAL DATA: Endotracheal tube

EXAM:
PORTABLE CHEST 1 VIEW

[chest ap]
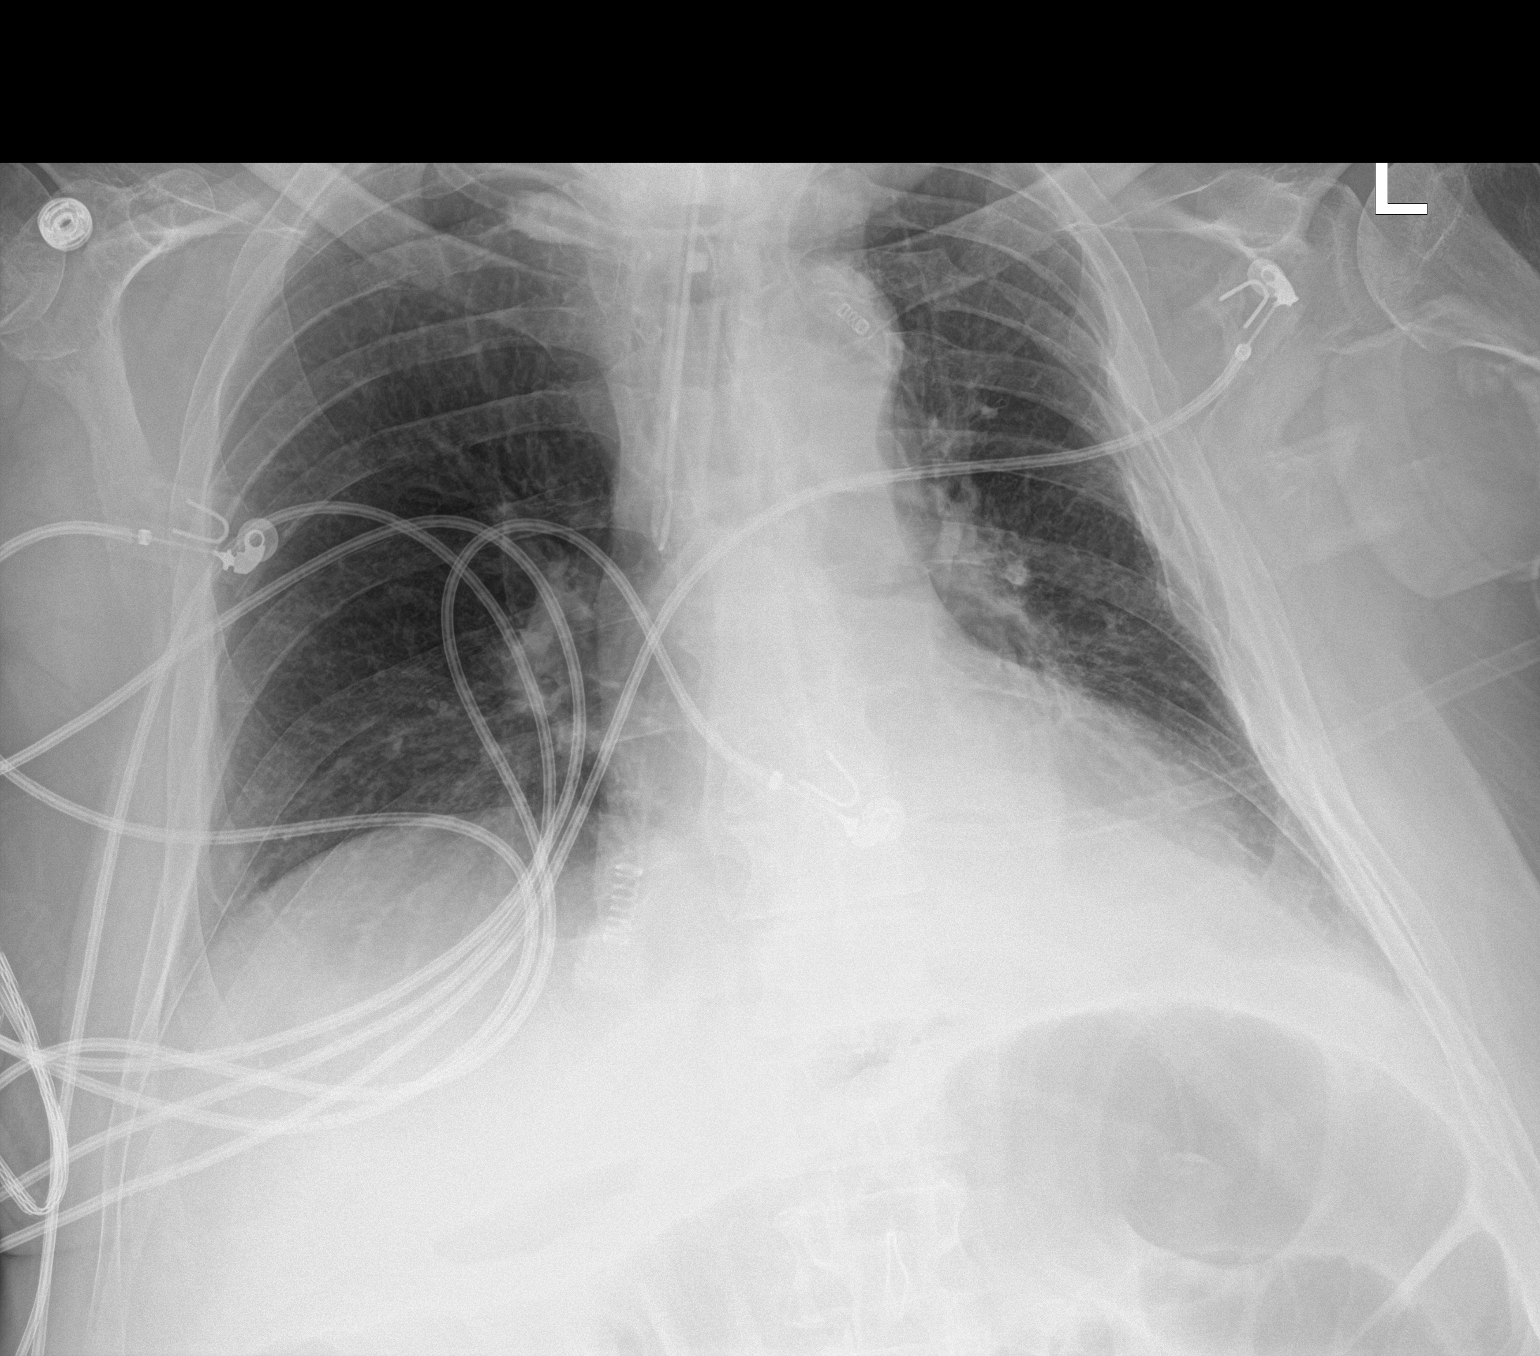

[1 of 1 positions shown; findings below may reference images not displayed]

FINDINGS: Endotracheal tube tip appears to be in the right mainstem bronchus.
Withdrawal of about 2.5 cm recommended. Shallow inspiration. Linear
atelectasis in the left lung base. No airspace disease or
consolidation shown in the lungs. Old left rib fractures and left
scapular fracture.
IMPRESSION: Endotracheal tube tip appears to be in the right mainstem bronchus.
Withdrawal of about 2.5 cm recommended.

These results will be called to the ordering clinician or
representative by the Radiologist Assistant, and communication
documented in the PACS or zVision Dashboard.

## 2020-05-31 IMAGING — DX DG CHEST 1V PORT
1 series · 1 of 1 positions shown · non-contrast
Comparison: 01/19/2018

CLINICAL DATA: Pneumonia

EXAM:
PORTABLE CHEST 1 VIEW

[chest]
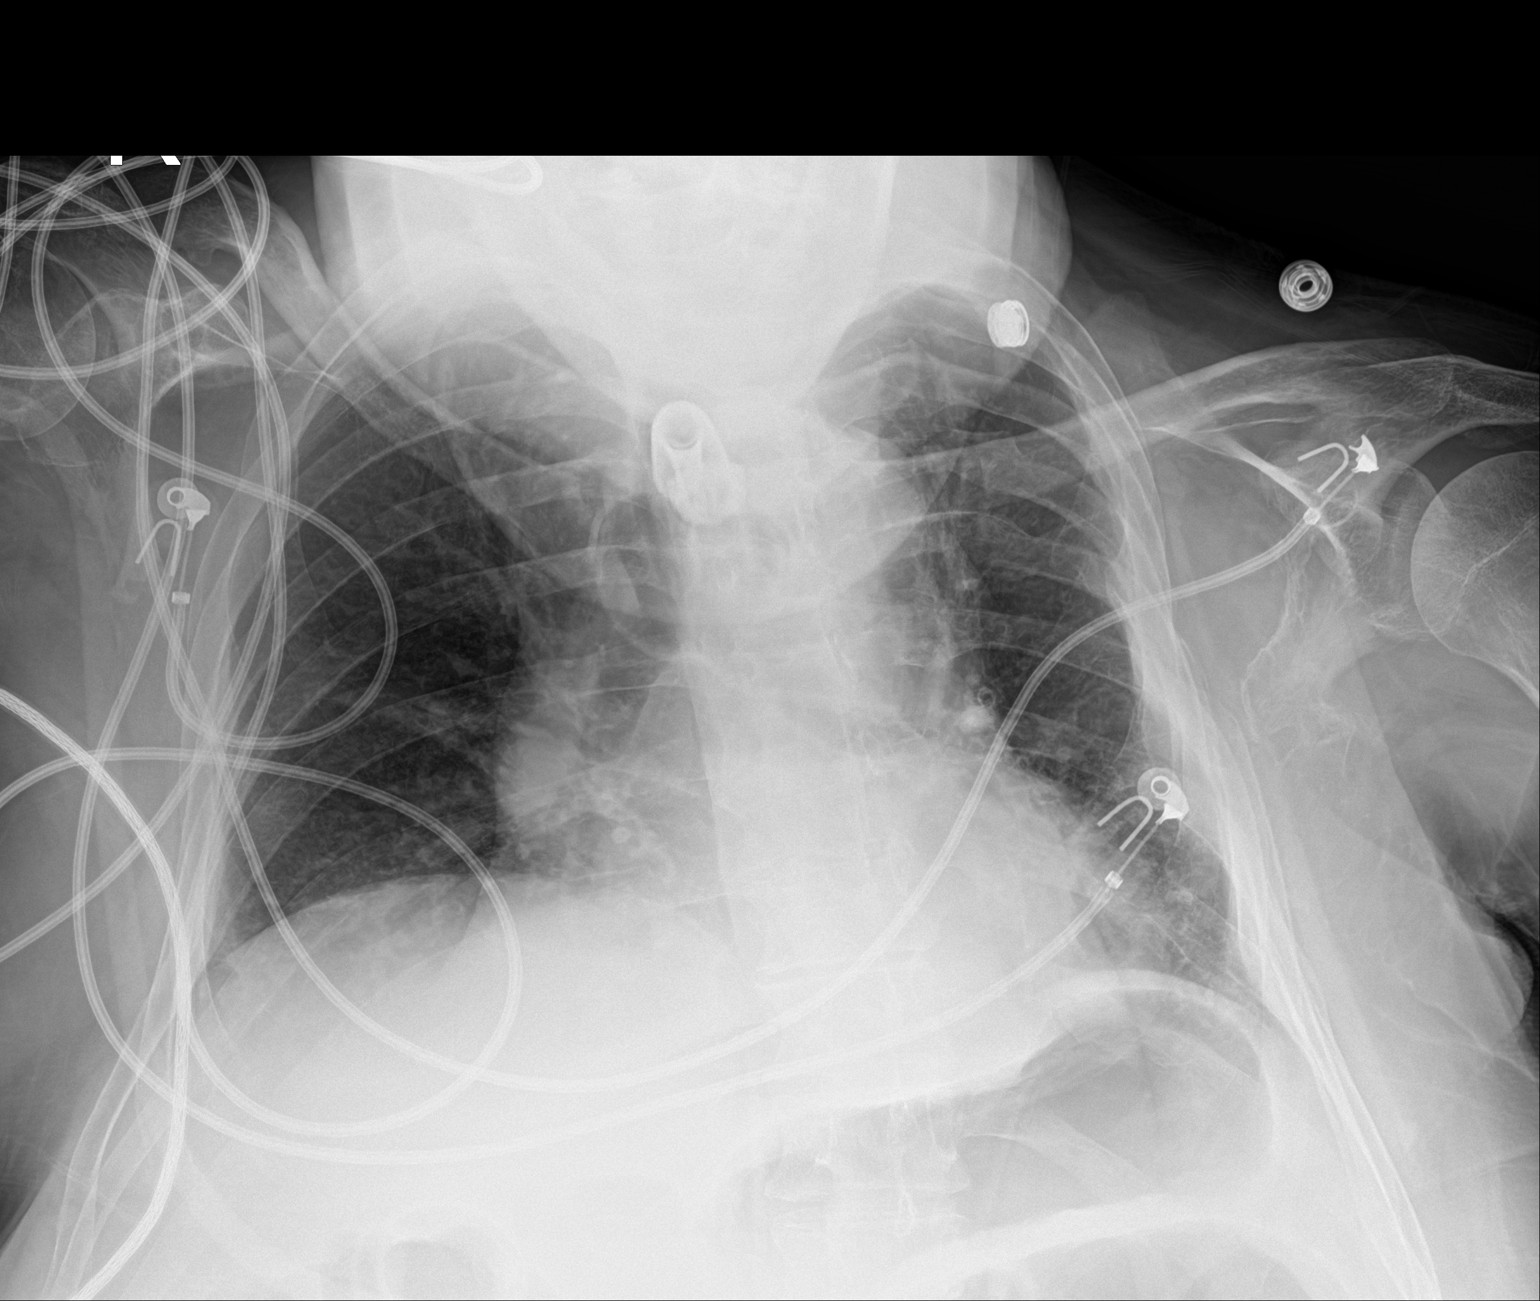

[1 of 1 positions shown; findings below may reference images not displayed]

FINDINGS: Tracheostomy tube is in place. The tip is 3.7 cm from the carina.
Upper normal heart size. Low lung volumes. Bibasilar atelectasis. No
pneumothorax or pleural effusion. Chronic left rib deformities.
IMPRESSION: Tracheostomy tube.

Bibasilar atelectasis.
